# Patient Record
Sex: Female | Born: 1955
Health system: Southern US, Community
[De-identification: ages and names within clinical notes are randomized; demographics above are authoritative.]

## PROBLEM LIST (undated history)

## (undated) DIAGNOSIS — M722 Plantar fascial fibromatosis: Secondary | ICD-10-CM

## (undated) DIAGNOSIS — I10 Essential (primary) hypertension: Secondary | ICD-10-CM

## (undated) DIAGNOSIS — Z8639 Personal history of other endocrine, nutritional and metabolic disease: Secondary | ICD-10-CM

## (undated) DIAGNOSIS — K219 Gastro-esophageal reflux disease without esophagitis: Secondary | ICD-10-CM

## (undated) HISTORY — DX: Plantar fascial fibromatosis: M72.2

## (undated) HISTORY — DX: Gastro-esophageal reflux disease without esophagitis: K21.9

## (undated) HISTORY — PX: TONSILLECTOMY: SUR1361

## (undated) HISTORY — DX: Personal history of other endocrine, nutritional and metabolic disease: Z86.39

## (undated) HISTORY — PX: APPENDECTOMY: SHX54

## (undated) HISTORY — DX: Essential (primary) hypertension: I10

## (undated) HISTORY — PX: ABDOMINAL HYSTERECTOMY: SUR658

---

## 2000-08-02 ENCOUNTER — Ambulatory Visit (HOSPITAL_COMMUNITY): Admission: RE | Admit: 2000-08-02 | Discharge: 2000-08-02 | Payer: Self-pay | Admitting: Obstetrics and Gynecology

## 2000-08-02 ENCOUNTER — Encounter: Payer: Self-pay | Admitting: Obstetrics and Gynecology

## 2000-09-02 ENCOUNTER — Ambulatory Visit (HOSPITAL_COMMUNITY): Admission: RE | Admit: 2000-09-02 | Discharge: 2000-09-02 | Payer: Self-pay | Admitting: Obstetrics and Gynecology

## 2000-09-02 ENCOUNTER — Encounter: Payer: Self-pay | Admitting: Obstetrics and Gynecology

## 2002-08-06 ENCOUNTER — Other Ambulatory Visit: Admission: RE | Admit: 2002-08-06 | Discharge: 2002-08-06 | Payer: Self-pay | Admitting: Obstetrics and Gynecology

## 2002-08-10 ENCOUNTER — Ambulatory Visit (HOSPITAL_COMMUNITY): Admission: RE | Admit: 2002-08-10 | Discharge: 2002-08-10 | Payer: Self-pay | Admitting: Obstetrics and Gynecology

## 2002-08-10 ENCOUNTER — Encounter: Payer: Self-pay | Admitting: Obstetrics and Gynecology

## 2002-09-27 ENCOUNTER — Encounter (INDEPENDENT_AMBULATORY_CARE_PROVIDER_SITE_OTHER): Payer: Self-pay

## 2002-09-27 ENCOUNTER — Inpatient Hospital Stay (HOSPITAL_COMMUNITY): Admission: RE | Admit: 2002-09-27 | Discharge: 2002-09-29 | Payer: Self-pay | Admitting: Obstetrics and Gynecology

## 2003-09-02 ENCOUNTER — Ambulatory Visit (HOSPITAL_COMMUNITY): Admission: RE | Admit: 2003-09-02 | Discharge: 2003-09-02 | Payer: Self-pay | Admitting: Obstetrics and Gynecology

## 2005-09-29 ENCOUNTER — Ambulatory Visit (HOSPITAL_COMMUNITY): Admission: RE | Admit: 2005-09-29 | Discharge: 2005-09-29 | Payer: Self-pay | Admitting: Obstetrics and Gynecology

## 2007-05-19 ENCOUNTER — Ambulatory Visit (HOSPITAL_COMMUNITY): Admission: RE | Admit: 2007-05-19 | Discharge: 2007-05-19 | Payer: Self-pay | Admitting: *Deleted

## 2007-09-12 ENCOUNTER — Ambulatory Visit (HOSPITAL_COMMUNITY): Admission: RE | Admit: 2007-09-12 | Discharge: 2007-09-12 | Payer: Self-pay | Admitting: Obstetrics and Gynecology

## 2007-09-26 ENCOUNTER — Encounter: Admission: RE | Admit: 2007-09-26 | Discharge: 2007-09-26 | Payer: Self-pay | Admitting: Obstetrics and Gynecology

## 2008-07-26 ENCOUNTER — Encounter: Admission: RE | Admit: 2008-07-26 | Discharge: 2008-07-26 | Payer: Self-pay | Admitting: Internal Medicine

## 2008-11-01 ENCOUNTER — Encounter: Admission: RE | Admit: 2008-11-01 | Discharge: 2008-11-01 | Payer: Self-pay | Admitting: Obstetrics and Gynecology

## 2010-05-29 ENCOUNTER — Encounter: Admission: RE | Admit: 2010-05-29 | Discharge: 2010-05-29 | Payer: Self-pay | Admitting: Obstetrics and Gynecology

## 2010-10-11 ENCOUNTER — Encounter: Payer: Self-pay | Admitting: Obstetrics and Gynecology

## 2011-02-02 NOTE — Op Note (Signed)
NAMEDIERRA, RIESGO NO.:  [id number]   MEDICAL RECORD NO.:  [id number]          PATIENT TYPE:  AMB   LOCATION:  ENDO                         FACILITY:  [facility]   PHYSICIAN:  Georgiana Spinner, M.D.    DATE OF BIRTH:  [birth year]   DATE OF PROCEDURE:  DATE OF DISCHARGE:                               OPERATIVE REPORT   SURGEON:  Sabino Gasser, MD.   PROCEDURE:  Upper endoscopy.   INDICATIONS FOR PROCEDURE:  GERD.   ANESTHESIA:  Fentanyl 75 mcg, Versed 5 mg.   PROCEDURE IN DETAIL:  With the patient mildly sedated in the left  lateral decubitus position the Pentax videoscopic endoscope was inserted  into the mouth, passed under direct vision through the esophagus which  appeared normal. There was no evidence of Barrett esophagus.   We entered into the stomach, fundus, body and antrum, duodenal bulb and  second portion of the duodenum, all appeared normal. From this point the  endoscope was slowly withdrawn taking circumferential views of duodenal  mucosa until the endoscope had been pulled back into the stomach, placed  in retroflexion, viewed the stomach from below. The endoscope was  straightened and withdrawn, taking circumferential views of remaining  gastric and esophageal mucosa.   The patient's  vital signs and pulse oximetry remained stable. The  patient tolerated the procedure well without apparent complications.   FINDINGS:  Rather unremarkable examination.   PLAN:  Have the patient continue on medication and follow up with me as  needed.           ______________________________  Georgiana Spinner, M.D.     GMO/MEDQ  D:  05/19/2007  T:  05/20/2007  Job:  [id number]

## 2011-02-05 NOTE — Discharge Summary (Signed)
   Linda Short, Linda Short                           ACCOUNT NO.:  [id number]   MEDICAL RECORD NO.:  [id number]                   PATIENT TYPE:  INP   LOCATION:  9311                                 FACILITY:  [facility]   PHYSICIAN:  Zenaida Niece, M.D.             DATE OF BIRTH:  02/19/1956   DATE OF ADMISSION:  09/27/2002  DATE OF DISCHARGE:  09/29/2002                                 DISCHARGE SUMMARY   ADMISSION DIAGNOSIS:  Symptomatic leiomyomatous uterus.   DISCHARGE DIAGNOSES:  1. Symptomatic leiomyomatous uterus.  2. Endometriosis.  3. Left ovarian cyst.   PROCEDURES:  Total abdominal hysterectomy, left ovarian cystectomy, and  fulguration of endometriosis.   BRIEF HISTORY AND PHYSICAL:  This is a 55 year old white female para 1-0-0-1  with an enlarging uterus with regular menses that are getting heavier.  Retrospectively, she does have some pelvic pressure.  Ultrasound reveals an  increase in size of previously seen fibroids and the patient is admitted for  definitive surgical therapy.  Past history is significant for one vaginal  delivery and an appendectomy.  Physical exam is significant for a benign  abdomen with the uterine fundus palpable about half way to the umbilicus.  On pelvic exam cervix is normal, vagina is normal, and bimanual exam reveals  a 14-week size slightly irregular uterus.   HOSPITAL COURSE:  The patient was admitted on the day of surgery and  underwent the above-mentioned procedure under general anesthesia without  complications.  Estimated blood loss was 250 cc.  Uterus was enlarged with  palpable myomas.  She had endometriosis on the right uterosacral ligament,  and the left ovary had a 2 cm cyst which appeared to be an endometrioma.  Postoperatively, she did very well, was rapidly able to ambulate and  tolerate a diet.  She remained afebrile.  Preoperative hemoglobin 9.2,  postoperative 8.2.  On the morning of postoperative day #2, her incision was  healing well and her staples were removed and Steri-Strips applied.  She was  felt to be stable enough for discharge at that time.   DISCHARGE INSTRUCTIONS:  Regular diet, pelvic rest, and no strenuous  activity.   FOLLOW UP:  In ten days.   DISCHARGE MEDICATIONS:  Percocet, dispense #30, one to two p.o. q.4-6 h.  p.r.n. pain; and the patient is to take Naprosyn that she has at home, and  over-the-counter iron b.i.d.                                               Zenaida Niece, M.D.    TDM/MEDQ  D:  09/29/2002  T:  09/30/2002  Job:  [id number]

## 2011-02-05 NOTE — H&P (Signed)
NAME:  Linda Short, Linda Short                           ACCOUNT NO.:  [id number]   MEDICAL RECORD NO.:  [id number]                   PATIENT TYPE:  INP   LOCATION:  NA                                   FACILITY:  [facility]   PHYSICIAN:  Zenaida Niece, M.D.             DATE OF BIRTH:  [birth year]   DATE OF ADMISSION:  09/27/2002  DATE OF DISCHARGE:                                HISTORY & PHYSICAL   CHIEF COMPLAINT:  Symptomatic fibroid uterus.   HISTORY OF PRESENT ILLNESS:  This is a 55 year old white female para 1-0-0-1  who was seen for an annual exam on August 06, 2002.  She stated at that  time she was having mostly regular menses with a couple of months of heavier  bleeding and crampier menses.  No intermenstrual bleeding.  Retrospectively,  she does complain of urinary frequency and possibly some discomfort with  intercourse and with bowel movements.  Physical exam at that time revealed  an enlarged uterus from her previous exam.  Ultrasound performed on November  21 reveals an enlarged uterus with a dominant fibroid in the posterior wall  of the uterus which is increased in size from a prior ultrasound, now  measuring 6.6 x 6.7 x 6.1 cm.  There is a second fibroid in the fundus  measuring 2.1 x 1.8 x 1.8 cm.  No new fibroids were identified.  The left  ovary was slightly enlarged, less so than on previous ultrasounds.  Due to  this fairly rapidly enlarged uterus and the symptoms, options were discussed  with the patient and she wishes to proceed with definitive surgery with  hysterectomy.   PAST OBSTETRICAL HISTORY:  One vaginal delivery at term without  complications.   PAST MEDICAL HISTORY:  Occasional heartburn.   PAST SURGICAL HISTORY:  Appendectomy.   ALLERGIES:  None known.   CURRENT MEDICATIONS:  None.   SOCIAL HISTORY:  The patient is single and denies alcohol, tobacco, or drug  use.   FAMILY HISTORY:  No GYN or colon cancer.   REVIEW OF SYSTEMS:  Otherwise  negative.   PHYSICAL EXAMINATION:  GENERAL:  This is a well-developed, well-nourished  white female who is in no acute distress.  Weight is about 145 pounds.  NECK:  Supple without lymphadenopathy or thyromegaly.  LUNGS:  Clear to auscultation.  HEART:  Regular rate and rhythm without murmur.  ABDOMEN:  Soft, nontender, nondistended with the uterine fundus palpable  about halfway to the umbilicus.  EXTREMITIES:  Have no edema, are nontender.  PELVIC:  External genitalia are within normal limits.  Speculum exam reveals  a normal cervix and Pap smear returns normal.  Bimanual exam reveals an  approximately 14-week-size, slightly irregular uterus and this is confirmed  on rectovaginal exam.   ASSESSMENT:  Symptomatic leiomyomatous uterus.  Medical and surgical options  have been discussed with the patient and she wishes to  proceed with  hysterectomy.  She wishes to preserve her ovaries unless they appear  abnormal.  Risks of surgery including bleeding, infection, and damage to  surrounding organs have been discussed with the patient and she agrees to  proceed.   PLAN:  Admit the patient on the day of surgery for a total abdominal  hysterectomy with possible bilateral salpingo-oophorectomy, with the plan of  leaving the ovaries unless they appear abnormal.                                               Zenaida Niece, M.D.    TDM/MEDQ  D:  09/25/2002  T:  09/25/2002  Job:  [id number]

## 2011-02-05 NOTE — Op Note (Signed)
NAME:  JUDEE, HENNICK                           ACCOUNT NO.:  [id number]   MEDICAL RECORD NO.:  [id number]                   PATIENT TYPE:  INP   LOCATION:  9311                                 FACILITY:  [facility]   PHYSICIAN:  Zenaida Niece, M.D.             DATE OF BIRTH:  07/08/1956   DATE OF PROCEDURE:  09/27/2002  DATE OF DISCHARGE:                                 OPERATIVE REPORT   PREOPERATIVE DIAGNOSES:  Symptomatic leiomyomatous uterus.   POSTOPERATIVE DIAGNOSES:  1. Symptomatic leiomyomatous uterus.  2. Endometriosis.  3. Left ovarian cyst.   PROCEDURE:  1. Total abdominal hysterectomy.  2. Left ovarian cystectomy.  3. Fulguration of endometriosis.   SURGEON:  Zenaida Niece, M.D.   ASSISTANT:  Malachi Pro. Ambrose Mantle, M.D.   ANESTHESIA:  General endotracheal tube.   ESTIMATED BLOOD LOSS:  250 cubic centimeters.   FINDINGS:  An enlarged uterus with palpable myomas, normal right tube and  ovary, endometriosis at the right uterosacral ligament, left ovary with a 2  cm cyst with thick chocolate fluid consistent with an endometrioma, and some  powder burn spots consistent with endometriosis.   PROCEDURE IN DETAIL:  The patient was taken to the operating room and placed  in the dorsal supine position.  General anesthesia was induced and her  abdomen and vagina were prepped and draped in the usual sterile fashion and  in-and-out Foley catheter inserted.  Her abdomen was then entered via  standard Pfannenstiel incision.  Self retaining retractor was placed and the  uterus was found to be enlarged, but very mobile.  Both uterine cornu were  grasped with long Kelly clamps.  Both round ligaments were divided with  electrocautery and the anterior broad ligament dissected and transected  across the entire portion of the uterus to create the bladder flap which was  pushed inferiorly sharply and bluntly.  A window was made in the broad  ligament on each side through an avascular  portion.  The utero-ovarian  pedicles were then clamped with Zeppelin clamps, transected, and doubly  ligated with number 1 chromic.  Uterine arteries were skeletonized and  uterine arteries were clamped with Zeppelin clamps.  These pedicles were  then transected and ligated with number 1 chromic.  Cardinal ligaments and  uterosacral ligaments were clamped, transected, and ligated on each side  with number 1 chromic.  This was done pushing the bladder inferiorly with  good results.  The vaginal angles were then clamped with Zeppelin clamps and  entered sharply.  The remainder of the vagina was cut with scissors and  uterus was removed.  Vaginal angles were sutured with number 1 chromic and  held for later use.  The remainder of the vagina was closed with interrupted  sutures and number 1 chromic with adequate closure and hemostasis.  Bleeding  from the patient's right side down close to the bladder was controlled  with  3-0 Vicryl.  Spots of endometriosis on the right uterosacral ligament were  fulgurated with monopolar cautery.  Upon inspection of the left ovary there  was a 2 cm cyst which through a small hole leaked chocolate appearing fluid.  Using sharp and blunt dissection this ovarian cyst was shelled out from the  remainder of the ovary.  I attempted to close the ovary after maintained  hemostatic with electrocautery but the sutures just pulled through the  ovarian serosa.  The ovary was made hemostatic with electrocautery.  The  pelvis was copiously irrigated.  The uterosacral ligaments were plicated in  the midline with one suture of number 1 chromic with good plication.  The  patient did not have a deep cul-de-sac.  All pedicles were inspected and  found to be hemostatic.  Both ureters were identified and found to be free  of the operative field.  All sutures were then cut.  The bowel had been  packed out of the pelvis with one lap pad which was then removed.  All  instruments  were then removed from the pelvis and the retractor was removed.  The subfascial space was inspected and made hemostatic with electrocautery.  The fascia was closed in a running fashion starting at both ends and meeting  in the middle with 0 Vicryl.  Subcutaneous tissue was irrigated and made  hemostatic with electrocautery.  The skin was then closed with staples and a  sterile dressing.  The patient tolerated the procedure well.  Was extubated  in the operating room and taken to the recovery room in stable condition.  Counts were correct x2 and she was given Ancef 1 g preoperatively.                                               Zenaida Niece, M.D.    TDM/MEDQ  D:  09/27/2002  T:  09/27/2002  Job:  [id number]

## 2011-06-16 ENCOUNTER — Other Ambulatory Visit: Payer: Self-pay | Admitting: Registered Nurse

## 2011-07-08 ENCOUNTER — Other Ambulatory Visit: Payer: Self-pay | Admitting: Obstetrics and Gynecology

## 2011-07-08 DIAGNOSIS — Z1231 Encounter for screening mammogram for malignant neoplasm of breast: Secondary | ICD-10-CM

## 2011-07-14 ENCOUNTER — Ambulatory Visit: Payer: Self-pay

## 2011-07-14 ENCOUNTER — Ambulatory Visit
Admission: RE | Admit: 2011-07-14 | Discharge: 2011-07-14 | Disposition: A | Discharge status: Home / Self Care | Payer: BC Managed Care – PPO | Source: Ambulatory Visit | Attending: Obstetrics and Gynecology | Admitting: Obstetrics and Gynecology

## 2011-07-14 DIAGNOSIS — Z1231 Encounter for screening mammogram for malignant neoplasm of breast: Secondary | ICD-10-CM

## 2011-10-30 ENCOUNTER — Telehealth: Payer: Self-pay | Admitting: Internal Medicine

## 2011-10-30 NOTE — Telephone Encounter (Signed)
Pt called to report sneezing, watery eyes, stuffy nose all starting last evening. Had EGD/colon yesterday with Dr. Loreta Ave. No fevers, chills, cp, or dyspnea. Some clear nasal discharge.  No cough. I am not sure this is related to the procedure/sedation, but recommended decongestant and antihistamine OTC.  Also can try saline nasal spray. Will call back if not better or worse.  I will pass along to Dr. Loreta Ave.

## 2012-01-07 ENCOUNTER — Ambulatory Visit: Payer: Self-pay | Admitting: Internal Medicine

## 2012-11-27 ENCOUNTER — Other Ambulatory Visit: Payer: Self-pay

## 2012-11-27 DIAGNOSIS — Z1231 Encounter for screening mammogram for malignant neoplasm of breast: Secondary | ICD-10-CM

## 2012-11-29 ENCOUNTER — Ambulatory Visit
Admission: RE | Admit: 2012-11-29 | Discharge: 2012-11-29 | Disposition: A | Discharge status: Home / Self Care | Payer: BC Managed Care – PPO | Source: Ambulatory Visit

## 2012-11-29 DIAGNOSIS — Z1231 Encounter for screening mammogram for malignant neoplasm of breast: Secondary | ICD-10-CM

## 2013-07-11 ENCOUNTER — Other Ambulatory Visit: Payer: Self-pay | Admitting: Obstetrics and Gynecology

## 2013-07-11 DIAGNOSIS — N6315 Unspecified lump in the right breast, overlapping quadrants: Secondary | ICD-10-CM

## 2013-07-11 DIAGNOSIS — N63 Unspecified lump in unspecified breast: Secondary | ICD-10-CM

## 2013-07-12 ENCOUNTER — Ambulatory Visit
Admission: RE | Admit: 2013-07-12 | Discharge: 2013-07-12 | Disposition: A | Discharge status: Home / Self Care | Payer: BC Managed Care – PPO | Source: Ambulatory Visit | Attending: Obstetrics and Gynecology | Admitting: Obstetrics and Gynecology

## 2013-07-12 DIAGNOSIS — N6315 Unspecified lump in the right breast, overlapping quadrants: Secondary | ICD-10-CM

## 2014-11-16 ENCOUNTER — Ambulatory Visit: Payer: Self-pay | Admitting: Family Medicine

## 2017-09-27 DIAGNOSIS — Z6830 Body mass index (BMI) 30.0-30.9, adult: Secondary | ICD-10-CM | POA: Diagnosis not present

## 2017-09-27 DIAGNOSIS — Z713 Dietary counseling and surveillance: Secondary | ICD-10-CM | POA: Diagnosis not present

## 2017-09-27 DIAGNOSIS — E6609 Other obesity due to excess calories: Secondary | ICD-10-CM | POA: Diagnosis not present

## 2017-10-11 DIAGNOSIS — Z6830 Body mass index (BMI) 30.0-30.9, adult: Secondary | ICD-10-CM | POA: Diagnosis not present

## 2017-10-11 DIAGNOSIS — Z713 Dietary counseling and surveillance: Secondary | ICD-10-CM | POA: Diagnosis not present

## 2017-10-11 DIAGNOSIS — E6609 Other obesity due to excess calories: Secondary | ICD-10-CM | POA: Diagnosis not present

## 2017-10-18 DIAGNOSIS — Z6830 Body mass index (BMI) 30.0-30.9, adult: Secondary | ICD-10-CM | POA: Diagnosis not present

## 2017-10-18 DIAGNOSIS — Z713 Dietary counseling and surveillance: Secondary | ICD-10-CM | POA: Diagnosis not present

## 2017-10-18 DIAGNOSIS — E6609 Other obesity due to excess calories: Secondary | ICD-10-CM | POA: Diagnosis not present

## 2017-11-01 DIAGNOSIS — Z713 Dietary counseling and surveillance: Secondary | ICD-10-CM | POA: Diagnosis not present

## 2017-11-01 DIAGNOSIS — E6609 Other obesity due to excess calories: Secondary | ICD-10-CM | POA: Diagnosis not present

## 2017-11-01 DIAGNOSIS — Z6830 Body mass index (BMI) 30.0-30.9, adult: Secondary | ICD-10-CM | POA: Diagnosis not present

## 2017-11-08 DIAGNOSIS — E6609 Other obesity due to excess calories: Secondary | ICD-10-CM | POA: Diagnosis not present

## 2017-11-08 DIAGNOSIS — Z713 Dietary counseling and surveillance: Secondary | ICD-10-CM | POA: Diagnosis not present

## 2017-11-08 DIAGNOSIS — Z6830 Body mass index (BMI) 30.0-30.9, adult: Secondary | ICD-10-CM | POA: Diagnosis not present

## 2017-11-22 DIAGNOSIS — E6609 Other obesity due to excess calories: Secondary | ICD-10-CM | POA: Diagnosis not present

## 2017-11-22 DIAGNOSIS — Z6830 Body mass index (BMI) 30.0-30.9, adult: Secondary | ICD-10-CM | POA: Diagnosis not present

## 2017-11-22 DIAGNOSIS — Z713 Dietary counseling and surveillance: Secondary | ICD-10-CM | POA: Diagnosis not present

## 2017-12-01 DIAGNOSIS — E6609 Other obesity due to excess calories: Secondary | ICD-10-CM | POA: Diagnosis not present

## 2017-12-01 DIAGNOSIS — Z6830 Body mass index (BMI) 30.0-30.9, adult: Secondary | ICD-10-CM | POA: Diagnosis not present

## 2017-12-01 DIAGNOSIS — Z713 Dietary counseling and surveillance: Secondary | ICD-10-CM | POA: Diagnosis not present

## 2017-12-06 DIAGNOSIS — E6609 Other obesity due to excess calories: Secondary | ICD-10-CM | POA: Diagnosis not present

## 2017-12-06 DIAGNOSIS — Z6830 Body mass index (BMI) 30.0-30.9, adult: Secondary | ICD-10-CM | POA: Diagnosis not present

## 2017-12-06 DIAGNOSIS — Z713 Dietary counseling and surveillance: Secondary | ICD-10-CM | POA: Diagnosis not present

## 2017-12-13 DIAGNOSIS — Z6830 Body mass index (BMI) 30.0-30.9, adult: Secondary | ICD-10-CM | POA: Diagnosis not present

## 2017-12-13 DIAGNOSIS — E6609 Other obesity due to excess calories: Secondary | ICD-10-CM | POA: Diagnosis not present

## 2017-12-13 DIAGNOSIS — Z713 Dietary counseling and surveillance: Secondary | ICD-10-CM | POA: Diagnosis not present

## 2017-12-27 DIAGNOSIS — E6609 Other obesity due to excess calories: Secondary | ICD-10-CM | POA: Diagnosis not present

## 2017-12-27 DIAGNOSIS — Z713 Dietary counseling and surveillance: Secondary | ICD-10-CM | POA: Diagnosis not present

## 2017-12-27 DIAGNOSIS — Z6830 Body mass index (BMI) 30.0-30.9, adult: Secondary | ICD-10-CM | POA: Diagnosis not present

## 2018-01-03 DIAGNOSIS — Z6830 Body mass index (BMI) 30.0-30.9, adult: Secondary | ICD-10-CM | POA: Diagnosis not present

## 2018-01-03 DIAGNOSIS — Z713 Dietary counseling and surveillance: Secondary | ICD-10-CM | POA: Diagnosis not present

## 2018-01-03 DIAGNOSIS — E6609 Other obesity due to excess calories: Secondary | ICD-10-CM | POA: Diagnosis not present

## 2018-01-11 DIAGNOSIS — Z6830 Body mass index (BMI) 30.0-30.9, adult: Secondary | ICD-10-CM | POA: Diagnosis not present

## 2018-01-11 DIAGNOSIS — E6609 Other obesity due to excess calories: Secondary | ICD-10-CM | POA: Diagnosis not present

## 2018-01-11 DIAGNOSIS — Z713 Dietary counseling and surveillance: Secondary | ICD-10-CM | POA: Diagnosis not present

## 2018-03-01 DIAGNOSIS — Z1389 Encounter for screening for other disorder: Secondary | ICD-10-CM | POA: Diagnosis not present

## 2018-03-01 DIAGNOSIS — Z01419 Encounter for gynecological examination (general) (routine) without abnormal findings: Secondary | ICD-10-CM | POA: Diagnosis not present

## 2018-03-01 DIAGNOSIS — Z130 Encounter for screening for diseases of the blood and blood-forming organs and certain disorders involving the immune mechanism: Secondary | ICD-10-CM | POA: Diagnosis not present

## 2018-06-23 DIAGNOSIS — Z0000 Encounter for general adult medical examination without abnormal findings: Secondary | ICD-10-CM | POA: Diagnosis not present

## 2018-06-28 DIAGNOSIS — I10 Essential (primary) hypertension: Secondary | ICD-10-CM | POA: Diagnosis not present

## 2018-06-28 DIAGNOSIS — Z0000 Encounter for general adult medical examination without abnormal findings: Secondary | ICD-10-CM | POA: Diagnosis not present

## 2018-06-28 DIAGNOSIS — E7800 Pure hypercholesterolemia, unspecified: Secondary | ICD-10-CM | POA: Diagnosis not present

## 2018-06-28 DIAGNOSIS — Z23 Encounter for immunization: Secondary | ICD-10-CM | POA: Diagnosis not present

## 2018-11-17 DIAGNOSIS — I10 Essential (primary) hypertension: Secondary | ICD-10-CM | POA: Diagnosis not present

## 2018-11-17 DIAGNOSIS — I83893 Varicose veins of bilateral lower extremities with other complications: Secondary | ICD-10-CM | POA: Diagnosis not present

## 2018-11-17 DIAGNOSIS — E7800 Pure hypercholesterolemia, unspecified: Secondary | ICD-10-CM | POA: Diagnosis not present

## 2018-11-17 DIAGNOSIS — M7121 Synovial cyst of popliteal space [Baker], right knee: Secondary | ICD-10-CM | POA: Diagnosis not present

## 2018-11-17 DIAGNOSIS — M7989 Other specified soft tissue disorders: Secondary | ICD-10-CM | POA: Diagnosis not present

## 2020-06-20 ENCOUNTER — Ambulatory Visit (INDEPENDENT_AMBULATORY_CARE_PROVIDER_SITE_OTHER): Payer: 59 | Admitting: Family Medicine

## 2020-06-20 ENCOUNTER — Ambulatory Visit: Payer: Self-pay

## 2020-06-20 ENCOUNTER — Other Ambulatory Visit: Payer: Self-pay

## 2020-06-20 ENCOUNTER — Encounter: Payer: Self-pay | Admitting: Family Medicine

## 2020-06-20 VITALS — BP 130/90 | HR 70 | Ht 64.0 in | Wt 192.0 lb

## 2020-06-20 DIAGNOSIS — M25512 Pain in left shoulder: Secondary | ICD-10-CM

## 2020-06-20 NOTE — Progress Notes (Signed)
Subjective:    I'm seeing this patient as a consultation for:  Merri Brunette, MD . Note will be routed back to referring provider/PCP.  CC: Shoulder pain   HPI: Patient is a 64 year old female presenting to [facility] for L shoulder pain X1 month. States used the power washer that same weekend it started. Front of shoulder that has a tight feeling. States that she has limited ROM she cannot put her arm behind her back.  She cannot recall any injury.  Radiating: yes down the arm to bicep Mechanical symptoms: no  Aggravating symptoms: cannot sleep on that side; lifting arm; putting arm behind back Neck pain: no Tried: ibuprofen; Advil; bengay; elevation  Past medical history, Surgical history, Family history, Social history, Allergies, and medications have been entered into the medical record, reviewed.   Review of Systems: No new headache, visual changes, nausea, vomiting, diarrhea, constipation, dizziness, abdominal pain, skin rash, fevers, chills, night sweats, weight loss, swollen lymph nodes, body aches, joint swelling, muscle aches, chest pain, shortness of breath, mood changes, visual or auditory hallucinations.   Objective:    Vitals:   06/20/20 1011  BP: 130/90  Pulse: 70  SpO2: 90%   General: Well Developed, well nourished, and in no acute distress.  Neuro/Psych: Alert and oriented x3, extra-ocular muscles intact, able to move all 4 extremities, sensation grossly intact. Skin: Warm and dry, no rashes noted.  Respiratory: Not using accessory muscles, speaking in full sentences, trachea midline.  Cardiovascular: Pulses palpable, no extremity edema. Abdomen: Does not appear distended. MSK: Left shoulder normal-appearing nontender Range of motion abduction 100 degrees internal rotation iliac crest posterior aspect, external rotation full. Strength intact abduction external and internal rotation.  Positive empty can Hawkins Neer's test.  Negative Yergason's  and speeds test.  C-spine normal-appearing nontender normal motion.  Lab and Radiology Results Diagnostic Limited MSK Ultrasound of: Left shoulder Biceps tendon intact normal-appearing Subscapularis tendon intact normal. Supraspinatus tendon intact moderate increased subacromial bursa thickness present. Infraspinatus tendon normal-appearing AC joint degenerative narrowed with effusion. Impression: Subacromial bursitis and AC DJD   Impression and Recommendations:    Assessment and Plan: 64 y.o. female with left shoulder pain due to subacromial bursitis predominantly.  Discussed treatment options and plan.  Plan for physical therapy and Voltaren gel.  Discussed possibility of injection patient would like to try conservative management first which is somewhat reasonable.  Recheck back in 6 weeks or so if not improved.  Next step would be injection plus or minus x-ray and MRI..   Orders Placed This Encounter  Procedures  . [place] LIMITED JOINT SPACE STRUCTURES UP LEFT(NO LINKED CHARGES)    Standing Status:   Future    Number of Occurrences:   1    Standing Expiration Date:   06/20/2021    Order Specific Question:   Reason for Exam (SYMPTOM  OR DIAGNOSIS REQUIRED)    Answer:   Left shoulder pain    Order Specific Question:   Preferred imaging location?    Answer:   Adult nurse [facility]  . Ambulatory referral to Physical Therapy    Referral Priority:   Routine    Referral Type:   Physical Medicine    Referral Reason:   Specialty Services Required    Requested Specialty:   Physical Therapy   No orders of the defined types were placed in this encounter.   Discussed warning signs or symptoms. Please see discharge instructions. Patient expresses understanding.  The above documentation has been reviewed and is accurate and complete Lynne Leader, M.D.

## 2020-06-20 NOTE — Patient Instructions (Signed)
Thank you for coming in today.  Lets plan for physical therapy.  Please use voltaren gel up to 4x daily for pain as needed.  Let me know if you do not hear from PT or have trouble scheduling.  If you know a specific location you want to go to that is helpful.   Recheck after about 4 weeks of PT (6 weeks from now) . Ok to cancel if feeling better.   Please let me know if you are having a problem or this is clearly not working.    Shoulder Impingement Syndrome  Shoulder impingement syndrome is a condition that causes pain when connective tissues (tendons) surrounding the shoulder joint become pinched. These tendons are part of the group of muscles and tissues that help to stabilize the shoulder (rotator cuff). Beneath the rotator cuff is a fluid-filled sac (bursa) that allows the muscles and tendons to glide smoothly. The bursa may become swollen or irritated (bursitis). Bursitis, swelling in the rotator cuff tendons, or both conditions can decrease how much space is under a bone in the shoulder joint (acromion), resulting in impingement. What are the causes? Shoulder impingement syndrome may be caused by bursitis or swelling of the rotator cuff tendons, which may result from:  Repetitive overhead arm movements.  Falling onto the shoulder.  Weakness in the shoulder muscles. What increases the risk? You may be more likely to develop this condition if you:  Play sports that involve throwing, such as baseball.  Participate in sports such as tennis, volleyball, and swimming.  Work as a Education administrator, Music therapist, or Pharmacologist. Some people are also more likely to develop impingement syndrome because of the shape of their acromion bone. What are the signs or symptoms? The main symptom of this condition is pain on the front or side of the shoulder. The pain may:  Get worse when lifting or raising the arm.  Get worse at night.  Wake you up from sleeping.  Feel sharp when the shoulder is  moved and then fade to an ache. Other symptoms may include:  Tenderness.  Stiffness.  Inability to raise the arm above shoulder level or behind the body.  Weakness. How is this diagnosed? This condition may be diagnosed based on:  Your symptoms and medical history.  A physical exam.  Imaging tests, such as: ? X-rays. ? MRI. ? Ultrasound. How is this treated? This condition may be treated by:  Resting your shoulder and avoiding all activities that cause pain or put stress on the shoulder.  Icing your shoulder.  NSAIDs to help reduce pain and swelling.  One or more injections of medicines to numb the area and reduce inflammation.  Physical therapy.  Surgery. This may be needed if nonsurgical treatments have not helped. Surgery may involve repairing the rotator cuff, reshaping the acromion, or removing the bursa. Follow these instructions at home: Managing pain, stiffness, and swelling   If directed, put ice on the injured area. ? Put ice in a plastic bag. ? Place a towel between your skin and the bag. ? Leave the ice on for 20 minutes, 2-3 times a day. Activity  Rest and return to your normal activities as told by your health care provider. Ask your health care provider what activities are safe for you.  Do exercises as told by your health care provider. General instructions  Do not use any products that contain nicotine or tobacco, such as cigarettes, e-cigarettes, and chewing tobacco. These can delay healing. If you  need help quitting, ask your health care provider.  Ask your health care provider when it is safe for you to drive.  Take over-the-counter and prescription medicines only as told by your health care provider.  Keep all follow-up visits as told by your health care provider. This is important. How is this prevented?  Give your body time to rest between periods of activity.  Be safe and responsible while being active. This will help you avoid  falls.  Maintain physical fitness, including strength and flexibility. Contact a health care provider if:  Your symptoms have not improved after 1-2 months of treatment and rest.  You cannot lift your arm away from your body. Summary  Shoulder impingement syndrome is a condition that causes pain when connective tissues (tendons) surrounding the shoulder joint become pinched.  The main symptom of this condition is pain on the front or side of the shoulder.  This condition is usually treated with rest, ice, and pain medicines as needed. This information is not intended to replace advice given to you by your health care provider. Make sure you discuss any questions you have with your health care provider. Document Revised: 12/29/2018 Document Reviewed: 03/01/2018 [employer] Patient Education  2020 [employer]    Shoulder Impingement Syndrome Rehab Ask your health care provider which exercises are safe for you. Do exercises exactly as told by your health care provider and adjust them as directed. It is normal to feel mild stretching, pulling, tightness, or discomfort as you do these exercises. Stop right away if you feel sudden pain or your pain gets worse. Do not begin these exercises until told by your health care provider. Stretching and range-of-motion exercise This exercise warms up your muscles and joints and improves the movement and flexibility of your shoulder. This exercise also helps to relieve pain and stiffness. Passive horizontal adduction In passive adduction, you use your other hand to move the injured arm toward your body. The injured arm does not move on its own. In this movement, your arm is moved across your body in the horizontal plane (horizontal adduction). 1. Sit or stand and pull your left / right elbow across your chest, toward your other shoulder. Stop when you feel a gentle stretch in the back of your shoulder and upper arm. ? Keep your arm at shoulder  height. ? Keep your arm as close to your body as you comfortably can. 2. Hold for __________ seconds. 3. Slowly return to the starting position. Repeat __________ times. Complete this exercise __________ times a day. Strengthening exercises These exercises build strength and endurance in your shoulder. Endurance is the ability to use your muscles for a long time, even after they get tired. External rotation, isometric This is an exercise in which you press the back of your wrist against a door frame without moving your shoulder joint (isometric). 1. Stand or sit in a doorway, facing the door frame. 2. Bend your left / right elbow and place the back of your wrist against the door frame. Only the back of your wrist should be touching the frame. Keep your upper arm at your side. 3. Gently press your wrist against the door frame, as if you are trying to push your arm away from your abdomen (external rotation). Press as hard as you are able without pain. ? Avoid shrugging your shoulder while you press your wrist against the door frame. Keep your shoulder blade tucked down toward the middle of your back. 4. Hold for  __________ seconds. 5. Slowly release the tension, and relax your muscles completely before you repeat the exercise. Repeat __________ times. Complete this exercise __________ times a day. Internal rotation, isometric This is an exercise in which you press your palm against a door frame without moving your shoulder joint (isometric). 1. Stand or sit in a doorway, facing the door frame. 2. Bend your left / right elbow and place the palm of your hand against the door frame. Only your palm should be touching the frame. Keep your upper arm at your side. 3. Gently press your hand against the door frame, as if you are trying to push your arm toward your abdomen (internal rotation). Press as hard as you are able without pain. ? Avoid shrugging your shoulder while you press your hand against the  door frame. Keep your shoulder blade tucked down toward the middle of your back. 4. Hold for __________ seconds. 5. Slowly release the tension, and relax your muscles completely before you repeat the exercise. Repeat __________ times. Complete this exercise __________ times a day. Scapular protraction, supine  1. Lie on your back on a firm surface (supine position). Hold a __________ weight in your left / right hand. 2. Raise your left / right arm straight into the air so your hand is directly above your shoulder joint. 3. Push the weight into the air so your shoulder (scapula) lifts off the surface that you are lying on. The scapula will push up or forward (protraction). Do not move your head, neck, or back. 4. Hold for __________ seconds. 5. Slowly return to the starting position. Let your muscles relax completely before you repeat this exercise. Repeat __________ times. Complete this exercise __________ times a day. Scapular retraction  1. Sit in a stable chair without armrests, or stand up. 2. Secure an exercise band to a stable object in front of you so the band is at shoulder height. 3. Hold one end of the exercise band in each hand. Your palms should face down. 4. Squeeze your shoulder blades together (retraction) and move your elbows slightly behind you. Do not shrug your shoulders upward while you do this. 5. Hold for __________ seconds. 6. Slowly return to the starting position. Repeat __________ times. Complete this exercise __________ times a day. Shoulder extension  1. Sit in a stable chair without armrests, or stand up. 2. Secure an exercise band to a stable object in front of you so the band is above shoulder height. 3. Hold one end of the exercise band in each hand. 4. Straighten your elbows and lift your hands up to shoulder height. 5. Squeeze your shoulder blades together and pull your hands down to the sides of your thighs (extension). Stop when your hands are straight  down by your sides. Do not let your hands go behind your body. 6. Hold for __________ seconds. 7. Slowly return to the starting position. Repeat __________ times. Complete this exercise __________ times a day. This information is not intended to replace advice given to you by your health care provider. Make sure you discuss any questions you have with your health care provider. Document Revised: 12/29/2018 Document Reviewed: 10/02/2018 [employer] Patient Education  2020 [employer].

## 2020-07-02 ENCOUNTER — Encounter: Payer: Self-pay | Admitting: Physical Therapy

## 2020-07-02 ENCOUNTER — Ambulatory Visit: Payer: 59 | Attending: Family Medicine | Admitting: Physical Therapy

## 2020-07-02 ENCOUNTER — Other Ambulatory Visit: Payer: Self-pay

## 2020-07-02 DIAGNOSIS — M25512 Pain in left shoulder: Secondary | ICD-10-CM | POA: Diagnosis not present

## 2020-07-02 DIAGNOSIS — M6281 Muscle weakness (generalized): Secondary | ICD-10-CM | POA: Diagnosis present

## 2020-07-02 NOTE — Therapy (Signed)
[facility] PH[facility]OR[facility] MEDICINE [address], [place], [address] Phone: [phone]   Fax:  [phone]  Physical Therapy Evaluation  Patient Details  Name: Linda Short [facility] MRN: [id number] Date of Birth: 1955/11/02 Referring Provider (PT): Rodolph Bong, MD   Encounter Date: 07/02/2020   PT End of Session - 07/02/20 1820    Visit Number 1    Number of Visits 24    Date for PT Re-Evaluation 09/24/20    Authorization Type [employer] reporting period from 07/02/2020    Progress Note Due on Visit 10    PT Start Time 1650    PT Stop Time 1750    PT Time Calculation (min) 60 min    Activity Tolerance Patient tolerated treatment well    Behavior During Therapy [facility] for tasks assessed/performed           History reviewed. No pertinent past medical history.  History reviewed. No pertinent surgical history.  There were no vitals filed for this visit.    Subjective Assessment - 07/02/20 1656    Subjective Patient reports she is unsure how her L shoulder pain started but it has been going on from mid to late July 2021.She can lift things okay but the range of motion over her head and behind her back is limited and painful. The only thing she remembers doing at the end of July ways power washing and the machine was on the ground and she was using her right arm. She was swimming in the ocean in mid August and that irritated. She feels like her pain is mostly staying the same since it started. No injections or oral steroids. Denies history of of neck pain/surgery/injury. Patient reports pain radiates over the L bicep but the most tender is at the anterior shoulder.    Pertinent History Patient is a 64 y.o. female who presents to outpatient physical therapy with a referral for medical diagnosis acute pain of left shoulder. This patient's chief complaints consist of left shoulder and arm pain and lack of ROM leading to the  following functional deficits: difficulty sleeping, lifting things overhead, reaching behind back and out to grab something, using L UE over head, grooming, dressing, swimming in the ocean, exercising for weight loss and fitness (especially working out arms). Relevant past medical history and comorbidities include hypertension, obesity. Patient denies hx of cancer, stroke, seizures, lung problem, major cardiac events, diabetes, unexplained weight loss, changes in bowel or bladder problems, new onset stumbling or dropping things, prolonged use of steroid medication. Bone density scan a few years ago was fine. She has not had COVID19 that she knows of and has had two Pfizer COVID19 vaccinations.    Limitations Lifting;Other (comment)   leeping, lifting things overhead, reaching behind back and out to grab something, using L UE over head, grooming, dressing, swimming in the ocean, exercising for weight loss and fitness (especially working out arms).   Diagnostic tests 06/20/2020 Diagnostic Limited [facility] Ultrasound of: Left shoulderBiceps tendon intact normal-appearingSubscapularis tendon intact normal.Supraspinatus tendon intact moderate increased subacromial bursa thickness present.Infraspinatus tendon normal-appearingAC joint degenerative narrowed with effusion.Impression: Subacromial bursitis and AC DJD    Patient Stated Goals reduce the pain and get full range of motion and function    Currently in Pain? Yes    Pain Score 2    W: 8/10; B 1/10   Pain Location Shoulder    Pain Orientation Left;Anterior;Proximal;Upper    Pain Descriptors / Indicators  Aching;Dull    Pain Radiating Towards denies numbness or tingling anywhere    Pain Onset More than a month ago    Pain Frequency Constant    Aggravating Factors  laying/sleeping on the left side, moving L UE behind back or overhead,    Pain Relieving Factors keep L UE below shoulders, resting L UE on pillow when sleeping and on the couch, voltaren gel.     Effect of Pain on Daily Activities Functional Limitations: sleeping, lifting things overhead, reaching behind back and out to grab something, using L UE over head, grooming, dressing, swimming in the ocean, exercising for weight loss and fitness (especially working out arms).              [facility] PT Assessment - 07/02/20 0001      Assessment   Medical Diagnosis Acute pain of left shoulder    Referring Provider (PT) Rodolph Bong, MD    Onset Date/Surgical Date --   end of july 2021   Hand Dominance Right    Next MD Visit 4 weeks (maybe)    Prior Therapy No PT for this condition prior to current episode of care      Precautions   Precautions None      Restrictions   Weight Bearing Restrictions No      Balance Screen   Has the patient fallen in the past 6 months No    Has the patient had a decrease in activity level because of a fear of falling?  No    Is the patient reluctant to leave their home because of a fear of falling?  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  Marland Kitchen Posture with mild forward head and rounded shoulders in sitting.  . Tremor: none . Muscle bulk: generally WFL . Bed mobility: supine <> sit I with care to avoid bothering L UE . Transfers: sit<>stand WFL . Gait: grossly WFL for household and short community ambulation. More detailed gait analysis deferred to later date as needed.    NEUROLOGICAL Dermatomes . C3-T1 appears equal and intact to light touch.   SPINE SCREEN Cervical Spine AROM WFL in all directions with overpressure no effect on L shoulder pain.  Negative cervical axial compression, distraction. Negative spurling's test bilaterally   PERIPHERAL JOINT MOTION (in degrees) Active Range of Motion (AROM) *Indicates pain 07/02/20 Date Date  Joint/Motion R/L R/L R/L  Shoulder     Flexion 155/101* / /  Extension 50/36* / /  Abduction 155/60* / /  External rotation 64/56 / /  Internal rotation T6/glute* / /  Comments:  07/02/20: ER measured at neutral position. B elbows and wrists WFL.   Passive Range of Motion (PROM) *Indicates pain 1013/21 Date Date  Joint/Motion R/L R/L R/L  Shoulder  Flexion /125* / /  Extension / / /  Abduction /80* / /  External rotation /55* / /  Internal rotation / / /  Comments:  07/02/20: Left shoulder ER at 40* abduction. R UE WNL.   MUSCLE PERFORMANCE (MMT):  *Indicates pain 07/02/20 Date Date  Joint/Motion R/L R/L R/L  Shoulder     Flexion 4/4 / /  Abduction 5/3+* / /  External rotation 4+/4* / /  Internal rotation 4+/4+ / /  Extension / / /  Elbow     Flexion 5/4+ / /  Extension 4+/4+ / /  Hand     Grip B WFL / /  Comments:  07/02/20: within available range  SPECIAL TESTS: Empty Can: L positive Hawkins-Kennedy: L positive Neer's test: L positive ER lag sign: L negative  Speed's test: L negative Yourgasun's: L negative (R negative and all above special tests)  PALPATION: - TTP at L supraspinatus region  Objective measurements completed on examination: See above findings.   TREATMENT:   Therapeutic exercise: to centralize symptoms and improve ROM, strength, muscular endurance, and activity tolerance required for successful completion of functional activities.  - Education on diagnosis, prognosis, POC, anatomy and physiology of current condition. Education on produce, no worse principle.   - supine AAROM B shoulder flexion with PVC stick, 1x20 in tolerated range.  - seated B shoulder ER against green theraband 1x 10 - standing scapular row with green theraband. 1x10 - Education on HEP including handout    Patient response to treatment:  Pt tolerated treatment well. Pt was able to complete all exercises with minimal to no lasting increase in pain or discomfort. Pt required multimodal cuing for proper technique and to facilitate improved neuromuscular control, strength, range of motion, and functional ability resulting in improved performance and form.   HOME EXERCISE PROGRAM  Access Code: [email or web address] URL: [email or web address] Date: 07/02/2020 Prepared by: Norton Blizzard  Exercises Supine Shoulder Flexion with Dowel - 2 x daily - 20 reps - 5 second s hold Shoulder External Rotation with Resistance - 1 x daily - 3 sets - 10 reps - 5 seconds hold Row with band/cable - 1 x daily - 3 sets - 10 reps - 5 seconds hold      PT Education - 07/02/20 1827    Education Details Exercise purpose/form. Self management techniques. Education on diagnosis, prognosis, POC, anatomy and physiology of current condition Education on HEP including handout    Person(s) Educated Patient    Methods Explanation;Demonstration;Tactile cues;Verbal cues;Handout    Comprehension Verbalized understanding;Returned demonstration;Verbal cues required;Tactile cues required;Need further instruction            PT Short Term Goals - 07/02/20 1821      PT SHORT TERM GOAL #1   Title Be independent with initial home exercise program for self-management of symptoms.    Baseline Initial HEP provided at IE (07/02/2020);    Time 2    Period Weeks    Status New    Target Date 07/16/20             PT Long Term Goals - 07/02/20 1816      PT LONG TERM GOAL #1   Title Be independent with a long-term home exercise program for self-management of symptoms.    Baseline Initial HEP provided at IE  (07/02/2020);    Time 12    Period Weeks    Status New   TARGET DATE FOR ALL LONG TERM GOIALS: 09/24/2020  PT LONG TERM GOAL #2   Title Demonstrate improved FOTO score to equal or greater than 69 by visit #9 to demonstrate improvement in overall condition and self-reported functional ability.    Baseline 62 (07/02/2020);    Time 12    Period Weeks    Status New      PT LONG TERM GOAL #3   Title Have full left shoulder AROM with no compensations or increase in pain in all planes except intermittent end range discomfort to allow patient to complete valued activities with less difficulty.    Baseline Limited and painful - see abjective exam (07/02/2020);    Time 12    Period Weeks    Status New      PT LONG TERM GOAL #4   Title Improve left shoulder strength to 4+/5 with no increase in pain for improved ability to allow patient to complete valued functional tasks such as reaching overhead and dressing with less difficulty.    Baseline weak and painful - see objective exam (07/02/2020);    Time 12    Period Weeks    Status New      PT LONG TERM GOAL #5   Title Complete community, work and/or recreational activities without limitation due to current condition.    Baseline Functional Limitations: sleeping, lifting things overhead, reaching behind back and out to grab something, using L UE over head, grooming, dressing, swimming in the ocean, exercising for weight loss and fitness (especially working out arms) (07/02/2020);    Time 12    Period Weeks    Status New                  Plan - 07/02/20 1824    Clinical Impression Statement Patient is a 64 y.o. female referred to outpatient physical therapy with a medical diagnosis of acute pain of left shoulder who presents with signs and symptoms consistent with left shoulder pain that can be described as rotator cuff related pain syndrome. Patient presents with significant pain, ROM, muscle performance (strength/power/endurance),  joint stiffness, and activity tolerance impairments that are limiting ability to complete her usual activities such as sleeping, lifting things overhead, reaching behind back and out to grab something, using L UE over head, grooming, dressing, swimming in the ocean, exercising for weight loss and fitness (especially working out arms) without difficulty. Patient will benefit from skilled physical therapy intervention to address current body structure impairments and activity limitations to improve function and work towards goals set in current POC in order to return to prior level of function or maximal functional improvement.    Personal Factors and Comorbidities Age;Comorbidity 2;Profession;Past/Current Experience;Time since onset of injury/illness/exacerbation    Comorbidities Relevant past medical history and comorbidities include hypertension, obesity.    Examination-Activity Limitations Bathing;Bed Mobility;Hygiene/Grooming;Lift;Reach Overhead;Sleep;Dressing    Examination-Participation Restrictions Other   sleeping, lifting things overhead, reaching behind back and out to grab something, using L UE over head, grooming, dressing, swimming in the ocean, exercising for weight loss and fitness (especially working out arms).   Stability/Clinical Decision Making Stable/Uncomplicated    Clinical Decision Making Low    Rehab Potential Excellent    PT Frequency 2x / week    PT Duration 12 weeks    PT Treatment/Interventions ADLs/Self Care Home Management;Aquatic Therapy;Cryotherapy;Moist Heat;Therapeutic activities;Therapeutic exercise;Neuromuscular re-education;Patient/family education;Manual techniques;Dry needling;Passive range of motion;Taping;Joint Manipulations;Spinal Manipulations    PT Next Visit Plan update HEP as appropriate, progressive strengthening and ROM as tolerated    PT Home Exercise  Plan [employer]  Access Code: [email or web address]    Consulted and Agree with Plan of Care Patient            Patient will benefit from skilled therapeutic intervention in order to improve the following deficits and impairments:  Pain, Increased muscle spasms, Decreased activity tolerance, Decreased endurance, Decreased range of motion, Decreased strength, Hypomobility, Impaired UE functional use, Impaired perceived functional ability, Impaired flexibility  Visit Diagnosis: Left shoulder pain, unspecified chronicity  Muscle weakness (generalized)     Problem List There are no problems to display for this patient.   Luretha Murphy. Ilsa Iha, PT, DPT 07/02/20, 6:28 PM  [facility] PHYS[facility] SPORTS MEDICINE [address] [place], [address] Phone: [phone]   Fax:  [phone]  Name: Linda Short MRN: [id number] Date of Birth: 07/19/1956

## 2020-07-07 ENCOUNTER — Ambulatory Visit: Payer: 59 | Admitting: Physical Therapy

## 2020-07-07 ENCOUNTER — Other Ambulatory Visit: Payer: Self-pay

## 2020-07-07 DIAGNOSIS — M25512 Pain in left shoulder: Secondary | ICD-10-CM | POA: Diagnosis not present

## 2020-07-07 DIAGNOSIS — M6281 Muscle weakness (generalized): Secondary | ICD-10-CM

## 2020-07-07 NOTE — Therapy (Signed)
[place] [facility]ORTS MEDICINE [address] [place], [address] Phone: [phone]   Fax:  [phone]  Physical Therapy Treatment  Patient Details  Name: Linda Short MRN: [id number] Date of Birth: 08/25/1956 Referring Provider (PT): Rodolph Bong, MD   Encounter Date: 07/07/2020   PT End of Session - 07/07/20 1426    Visit Number 2    Number of Visits 24    Date for PT Re-Evaluation 09/24/20    Authorization Type [employer] reporting period from 07/02/2020    Progress Note Due on Visit 10    PT Start Time 1306    PT Stop Time 1345    PT Time Calculation (min) 39 min    Activity Tolerance Patient tolerated treatment well    Behavior During Therapy [facility] for tasks assessed/performed           No past medical history on file.  No past surgical history on file.  There were no vitals filed for this visit.    TREATMENT:  denies sensitivity to latex  Therapeutic exercise:to centralize symptoms and improve ROM, strength, muscular endurance, and activity tolerance required for successful completion of functional activities.  - standing scapular row with cable @15 # 3x10 - seated B shoulder ER against green theraband 1x 10  Circuit: - standing L shoulder ER with towel roll under arm, 3x10 with red theraband (attempted green but too tough) - standing L shoulder ER with towel roll under arm, 3x10 with green theraband  Circuit:  - ambulation x 100 feet while holding DB in left hand with shoulder abducted 30-45 degrees, 1x2#DB, 3x4#DB - standing R shoulder lateral raise to 90 degrees abduction, thumb forward, 2x4#DB, and 1x6#DB.   - hooklying AAROM B shoulder flexion with PVC stick, 1x20 in tolerated range.  - hooklying chest press with PVC loaded with 7.5#AW  Manual therapy: to reduce pain and tissue tension, improve range of motion, neuromodulation, in order to promote improved ability to complete  functional activities. - hooklying PROM L shoulder flexion x 15, abduction x2 (painful) - hooklying L GHJ mobilizations grades II-III posterior, caudal in neutral, caudal as moving to flexion and abduction and in abduction.    HOME EXERCISE PROGRAM  Access Code: URL: [email or web address] Date: 07/02/2020 Prepared by: 07/04/2020  Exercises Supine Shoulder Flexion with Dowel - 2 x daily - 20 reps - 5 second s hold Shoulder External Rotation with Resistance - 1 x daily - 3 sets - 10 reps - 5 seconds hold Row with band/cable - 1 x daily - 3 sets - 10 reps - 5 seconds hold    PT Education - 07/07/20 1415    Education Details Exercise purpose/form. Self management techniques    Person(s) Educated Patient    Methods Explanation;Demonstration;Tactile cues;Verbal cues;Handout    Comprehension Verbalized understanding;Returned demonstration;Tactile cues required;Verbal cues required;Need further instruction            PT Short Term Goals - 07/07/20 1415      PT SHORT TERM GOAL #1   Title Be independent with initial home exercise program for self-management of symptoms.    Baseline Initial HEP provided at IE (07/02/2020);    Time 2    Period Weeks    Status Achieved    Target Date 07/16/20             PT Long Term Goals - 07/02/20 1816      PT LONG TERM GOAL #1  Title Be independent with a long-term home exercise program for self-management of symptoms.    Baseline Initial HEP provided at IE (07/02/2020);    Time 12    Period Weeks    Status New   TARGET DATE FOR ALL LONG TERM GOIALS: 09/24/2020     PT LONG TERM GOAL #2   Title Demonstrate improved FOTO score to equal or greater than 69 by visit #9 to demonstrate improvement in overall condition and self-reported functional ability.    Baseline 62 (07/02/2020);    Time 12    Period Weeks    Status New      PT LONG TERM GOAL #3   Title Have full left shoulder AROM with no compensations or  increase in pain in all planes except intermittent end range discomfort to allow patient to complete valued activities with less difficulty.    Baseline Limited and painful - see abjective exam (07/02/2020);    Time 12    Period Weeks    Status New      PT LONG TERM GOAL #4   Title Improve left shoulder strength to 4+/5 with no increase in pain for improved ability to allow patient to complete valued functional tasks such as reaching overhead and dressing with less difficulty.    Baseline weak and painful - see objective exam (07/02/2020);    Time 12    Period Weeks    Status New      PT LONG TERM GOAL #5   Title Complete community, work and/or recreational activities without limitation due to current condition.    Baseline Functional Limitations: sleeping, lifting things overhead, reaching behind back and out to grab something, using L UE over head, grooming, dressing, swimming in the ocean, exercising for weight loss and fitness (especially working out arms) (07/02/2020);    Time 12    Period Weeks    Status New                 Plan - 07/07/20 1415    Clinical Impression Statement Patient tolerated treatment well overall and shows improvement in AROM, PROM as well as activity tolerance this session. Continues to be unable to abduct left shoulder to 90 degrees. Patient would benefit from continued management of limiting condition by skilled physical therapist to address remaining impairments and functional limitations to work towards stated goals and return to PLOF or maximal functional independence.    Personal Factors and Comorbidities Age;Comorbidity 2;Profession;Past/Current Experience;Time since onset of injury/illness/exacerbation    Comorbidities Relevant past medical history and comorbidities include hypertension, obesity.    Examination-Activity Limitations Bathing;Bed Mobility;Hygiene/Grooming;Lift;Reach Overhead;Sleep;Dressing    Examination-Participation Restrictions  Other   sleeping, lifting things overhead, reaching behind back and out to grab something, using L UE over head, grooming, dressing, swimming in the ocean, exercising for weight loss and fitness (especially working out arms).   Stability/Clinical Decision Making Stable/Uncomplicated    Rehab Potential Excellent    PT Frequency 2x / week    PT Duration 12 weeks    PT Treatment/Interventions ADLs/Self Care Home Management;Aquatic Therapy;Cryotherapy;Moist Heat;Therapeutic activities;Therapeutic exercise;Neuromuscular re-education;Patient/family education;Manual techniques;Dry needling;Passive range of motion;Taping;Joint Manipulations;Spinal Manipulations    PT Next Visit Plan update HEP as appropriate, progressive strengthening and ROM as tolerated    PT Home Exercise Plan Medbridge  Access Code: [email or web address]    Consulted and Agree with Plan of Care Patient           Patient will benefit from skilled therapeutic intervention in order to  improve the following deficits and impairments:  Pain, Increased muscle spasms, Decreased activity tolerance, Decreased endurance, Decreased range of motion, Decreased strength, Hypomobility, Impaired UE functional use, Impaired perceived functional ability, Impaired flexibility  Visit Diagnosis: Left shoulder pain, unspecified chronicity  Muscle weakness (generalized)     Problem List There are no problems to display for this patient.   Luretha Murphy. Ilsa Iha, PT, DPT 07/07/20, 2:28 PM  [place] [facility] PHYSICAL AND SPORTS MEDICINE [address] [place], [address] Phone: [phone]   Fax:  [phone]  Name: Linda Short MRN: [id number] Date of Birth: Apr 02, 1956

## 2020-07-08 ENCOUNTER — Encounter: Payer: 59 | Admitting: Physical Therapy

## 2020-07-09 ENCOUNTER — Ambulatory Visit: Payer: 59 | Admitting: Physical Therapy

## 2020-07-16 ENCOUNTER — Encounter: Payer: Self-pay | Admitting: Physical Therapy

## 2020-07-16 ENCOUNTER — Other Ambulatory Visit: Payer: Self-pay

## 2020-07-16 ENCOUNTER — Ambulatory Visit: Payer: 59 | Admitting: Physical Therapy

## 2020-07-16 DIAGNOSIS — M25512 Pain in left shoulder: Secondary | ICD-10-CM | POA: Diagnosis not present

## 2020-07-16 DIAGNOSIS — M6281 Muscle weakness (generalized): Secondary | ICD-10-CM

## 2020-07-16 NOTE — Therapy (Signed)
[facility] PHYSICAL AND SPORTS MEDICINE [address] [place], [address] Phone: [phone]   Fax:  [phone]  Physical Therapy Treatment  Patient Details  Name: Linda Short [facility] MRN: [id number] Date of Birth: [birth year] Referring Provider (PT): Rodolph Bong, MD   Encounter Date: 07/16/2020   PT End of Session - 07/16/20 1641    Visit Number 3    Number of Visits 24    Date for PT Re-Evaluation 09/24/20    Authorization Type [employer] reporting period from 07/02/2020    Progress Note Due on Visit 10    PT Start Time 1631    PT Stop Time 1700    PT Time Calculation (min) 29 min    Activity Tolerance Patient tolerated treatment well    Behavior During Therapy [facility] for tasks assessed/performed           History reviewed. No pertinent past medical history.  History reviewed. No pertinent surgical history.  There were no vitals filed for this visit.   Subjective Assessment - 07/16/20 1633    Subjective Patient reports she is feeling well today with no pain. Feels like she can raise her arm more to the front and side but still tight in the back. No exessive pain or soreness following last session. HEP is going so so because she states she is going only every other day but no problems when doing them .    Pertinent History Patient is a 64 y.o. female who presents to outpatient physical therapy with a referral for medical diagnosis acute pain of left shoulder. This patient's chief complaints consist of left shoulder and arm pain and lack of ROM leading to the following functional deficits: difficulty sleeping, lifting things overhead, reaching behind back and out to grab something, using L UE over head, grooming, dressing, swimming in the ocean, exercising for weight loss and fitness (especially working out arms). Relevant past medical history and comorbidities include hypertension, obesity. Patient denies hx of cancer, stroke,  seizures, lung problem, major cardiac events, diabetes, unexplained weight loss, changes in bowel or bladder problems, new onset stumbling or dropping things, prolonged use of steroid medication. Bone density scan a few years ago was fine. She has not had COVID19 that she knows of and has had two Pfizer COVID19 vaccinations.    Limitations Lifting;Other (comment)   leeping, lifting things overhead, reaching behind back and out to grab something, using L UE over head, grooming, dressing, swimming in the ocean, exercising for weight loss and fitness (especially working out arms).   Diagnostic tests 06/20/2020 Diagnostic Limited MSK Ultrasound of: Left shoulderBiceps tendon intact normal-appearingSubscapularis tendon intact normal.Supraspinatus tendon intact moderate increased subacromial bursa thickness present.Infraspinatus tendon normal-appearingAC joint degenerative narrowed with effusion.Impression: Subacromial bursitis and AC DJD    Patient Stated Goals reduce the pain and get full range of motion and function    Currently in Pain? No/denies    Pain Onset More than a month ago            TREATMENT: denies sensitivity to latex  Therapeutic exercise:to centralize symptoms and improve ROM, strength, muscular endurance, and activity tolerance required for successful completion of functional activities. - standing scapular row with cable 1x20@20 #, 1x10 with black theraband, 1x20@#25 cable - seated B shoulder ER against blue theraband 2x 10  - Standing AAROM left shoulder flexion 2x10 (trial of scaption included). 5 second holds   Circuit: - standing L shoulder ER with towel roll under  arm, 3x15 with red theraband (attempted green but too tough) - standing L shoulder IR with towel roll under arm, 3x20/15/15 with blue theraband  - standing B shoulder lateral raise to ~90 degrees abduction (as allowed by pain), thumb forward, 3x10   - Education on HEP including handout   Pt required  multimodal cuing for proper technique and to facilitate improved neuromuscular control, strength, range of motion, and functional ability resulting in improved performance and form.   HOME EXERCISE PROGRAM Access Code: [email or web address] URL: [email or web address] Date: 07/16/2020 Prepared by: Norton Blizzard  Exercises Shoulder Flexion Wall Slide with Towel - 2 x daily - 20 reps - 5 second hold Shoulder External Rotation with Resistance - 1 x daily - 3 sets - 10 reps - 5 seconds hold Row with band/cable - 1 x daily - 3 sets - 10 reps - 5 seconds hold Seated Shoulder Abduction - Palms Down - 1 x daily - 3 sets - 10 reps     PT Education - 07/16/20 1641    Education Details Exercise purpose/form. Self management techniques    Person(s) Educated Patient    Methods Explanation;Demonstration;Tactile cues;Verbal cues    Comprehension Verbalized understanding;Returned demonstration;Verbal cues required;Tactile cues required;Need further instruction            PT Short Term Goals - 07/07/20 1415      PT SHORT TERM GOAL #1   Title Be independent with initial home exercise program for self-management of symptoms.    Baseline Initial HEP provided at IE (07/02/2020);    Time 2    Period Weeks    Status Achieved    Target Date 07/16/20             PT Long Term Goals - 07/02/20 1816      PT LONG TERM GOAL #1   Title Be independent with a long-term home exercise program for self-management of symptoms.    Baseline Initial HEP provided at IE (07/02/2020);    Time 12    Period Weeks    Status New   TARGET DATE FOR ALL LONG TERM GOIALS: 09/24/2020     PT LONG TERM GOAL #2   Title Demonstrate improved FOTO score to equal or greater than 69 by visit #9 to demonstrate improvement in overall condition and self-reported functional ability.    Baseline 62 (07/02/2020);    Time 12    Period Weeks    Status New      PT LONG TERM GOAL #3   Title Have full left shoulder AROM with no  compensations or increase in pain in all planes except intermittent end range discomfort to allow patient to complete valued activities with less difficulty.    Baseline Limited and painful - see abjective exam (07/02/2020);    Time 12    Period Weeks    Status New      PT LONG TERM GOAL #4   Title Improve left shoulder strength to 4+/5 with no increase in pain for improved ability to allow patient to complete valued functional tasks such as reaching overhead and dressing with less difficulty.    Baseline weak and painful - see objective exam (07/02/2020);    Time 12    Period Weeks    Status New      PT LONG TERM GOAL #5   Title Complete community, work and/or recreational activities without limitation due to current condition.    Baseline Functional Limitations: sleeping, lifting things overhead, reaching behind back and  out to grab something, using L UE over head, grooming, dressing, swimming in the ocean, exercising for weight loss and fitness (especially working out arms) (07/02/2020);    Time 12    Period Weeks    Status New                 Plan - 07/16/20 1958    Clinical Impression Statement Patient tolerated treatment well and was able to advance exercises slightly. Updated HEP to reflect advancements. Patient had temporary discomfort with shoulder abduction  exercise but pain resolved each rep and afterwards. Educated carefully about acceptable discomfort and how to modify exercises in different pain situations at home. Patient shows improvement in AROM but is still quite limited.  Patient would benefit from continued management of limiting condition by skilled physical therapist to address remaining impairments and functional limitations to work towards stated goals and return to PLOF or maximal functional independence.    Personal Factors and Comorbidities Age;Comorbidity 2;Profession;Past/Current Experience;Time since onset of injury/illness/exacerbation    Comorbidities  Relevant past medical history and comorbidities include hypertension, obesity.    Examination-Activity Limitations Bathing;Bed Mobility;Hygiene/Grooming;Lift;Reach Overhead;Sleep;Dressing    Examination-Participation Restrictions Other   sleeping, lifting things overhead, reaching behind back and out to grab something, using L UE over head, grooming, dressing, swimming in the ocean, exercising for weight loss and fitness (especially working out arms).   Stability/Clinical Decision Making Stable/Uncomplicated    Rehab Potential Excellent    PT Frequency 2x / week    PT Duration 12 weeks    PT Treatment/Interventions ADLs/Self Care Home Management;Aquatic Therapy;Cryotherapy;Moist Heat;Therapeutic activities;Therapeutic exercise;Neuromuscular re-education;Patient/family education;Manual techniques;Dry needling;Passive range of motion;Taping;Joint Manipulations;Spinal Manipulations    PT Next Visit Plan update HEP as appropriate, progressive strengthening and ROM as tolerated    PT Home Exercise Plan Medbridge  Access Code: [email or web address]    Consulted and Agree with Plan of Care Patient           Patient will benefit from skilled therapeutic intervention in order to improve the following deficits and impairments:  Pain, Increased muscle spasms, Decreased activity tolerance, Decreased endurance, Decreased range of motion, Decreased strength, Hypomobility, Impaired UE functional use, Impaired perceived functional ability, Impaired flexibility  Visit Diagnosis: Left shoulder pain, unspecified chronicity  Muscle weakness (generalized)     Problem List There are no problems to display for this patient.   Luretha Murphy. Ilsa Iha, PT, DPT 07/16/20, 7:59 PM   [facility] PHYSICAL AND SPORTS MEDICINE [address], [place], [address] Phone: [phone]   Fax:  [phone]  Name: Linda Short MRN: [id number] Date of Birth: 11-14-1955

## 2020-07-21 ENCOUNTER — Other Ambulatory Visit: Payer: Self-pay

## 2020-07-21 ENCOUNTER — Ambulatory Visit: Payer: 59 | Attending: Family Medicine | Admitting: Physical Therapy

## 2020-07-21 ENCOUNTER — Encounter: Payer: Self-pay | Admitting: Physical Therapy

## 2020-07-21 DIAGNOSIS — M25512 Pain in left shoulder: Secondary | ICD-10-CM | POA: Diagnosis not present

## 2020-07-21 DIAGNOSIS — M6281 Muscle weakness (generalized): Secondary | ICD-10-CM | POA: Insufficient documentation

## 2020-07-21 NOTE — Therapy (Signed)
[facility] PHYSICAL AND SPORTS MEDICINE [address] [place], [address] Phone: [phone]   Fax:  [phone]  Physical Therapy Treatment  Patient Details  Name: Linda Short [facility] MRN: [id number] Date of Birth: 06-16-1956 Referring Provider (PT): Rodolph Bong, MD   Encounter Date: 07/21/2020   PT End of Session - 07/21/20 1441    Visit Number 4    Number of Visits 24    Date for PT Re-Evaluation 09/24/20    Authorization Type [employer] reporting period from 07/02/2020    Progress Note Due on Visit 10    PT Start Time 1441    PT Stop Time 1510    PT Time Calculation (min) 29 min    Activity Tolerance Patient tolerated treatment well    Behavior During Therapy [facility] for tasks assessed/performed           History reviewed. No pertinent past medical history.  History reviewed. No pertinent surgical history.  There were no vitals filed for this visit.   Subjective Assessment - 07/21/20 1441    Subjective Pateint reports she had some increaed pain following last treatment session and when she woke up and slept on the couch to help keep it in the same position. No pain at rest currently but still painful when she reaches into abduction. Was able to go back to her usual HEP yesterday. She avoided all HEP since she last was at PT.    Pertinent History Patient is a 64 y.o. female who presents to outpatient physical therapy with a referral for medical diagnosis acute pain of left shoulder. This patient's chief complaints consist of left shoulder and arm pain and lack of ROM leading to the following functional deficits: difficulty sleeping, lifting things overhead, reaching behind back and out to grab something, using L UE over head, grooming, dressing, swimming in the ocean, exercising for weight loss and fitness (especially working out arms). Relevant past medical history and comorbidities include hypertension, obesity. Patient  denies hx of cancer, stroke, seizures, lung problem, major cardiac events, diabetes, unexplained weight loss, changes in bowel or bladder problems, new onset stumbling or dropping things, prolonged use of steroid medication. Bone density scan a few years ago was fine. She has not had COVID19 that she knows of and has had two Pfizer COVID19 vaccinations.    Limitations Lifting;Other (comment)   leeping, lifting things overhead, reaching behind back and out to grab something, using L UE over head, grooming, dressing, swimming in the ocean, exercising for weight loss and fitness (especially working out arms).   Diagnostic tests 06/20/2020 Diagnostic Limited MSK Ultrasound of: Left shoulderBiceps tendon intact normal-appearingSubscapularis tendon intact normal.Supraspinatus tendon intact moderate increased subacromial bursa thickness present.Infraspinatus tendon normal-appearingAC joint degenerative narrowed with effusion.Impression: Subacromial bursitis and AC DJD    Patient Stated Goals reduce the pain and get full range of motion and function    Currently in Pain? No/denies    Pain Onset More than a month ago           TREATMENT: denies sensitivity to latex  Therapeutic exercise:to centralize symptoms and improve ROM, strength, muscular endurance, and activity tolerance required for successful completion of functional activities. - sidelying L shoulder ER with towel roll under arm, 1x10 with 3#DB, 2x10 with 2#DB.  - sidelying L shoulder abduction AROM in tolerated range, 3x10 - Education on HEP including handout   Pt required multimodal cuing for proper technique and to facilitate improved neuromuscular control,  strength, range of motion, and functional ability resulting in improved performance and form.  Manual therapy: to reduce pain and tissue tension, improve range of motion, neuromodulation, in order to promote improved ability to complete functional activities. - hooklying PROM L  shoulder flexion x 15, abduction x5 (painful), ER x 10 - hooklying L GHJ mobilizations grades II-III posterior, caudal in neutral, caudal as moving to flexion and abduction and in abduction.   HOME EXERCISE PROGRAM Access Code: [email or web address] URL: [email or web address] Date: 07/21/2020 Prepared by: Norton Blizzard  Exercises Shoulder Flexion Wall Slide with Towel - 2 x daily - 20 reps - 5 second hold Supine Shoulder Flexion Extension AAROM with [place] - 2 x daily - 20 reps - 5 seconds hold Row with band/cable - 1 x daily - 3 sets - 10 reps - 5 seconds hold Sidelying Shoulder External Rotation with Dumbbell - 1 x daily - 3 sets - 10 reps Sidelying Shoulder Abduction Palm Forward - 1 x daily - 3 sets - 10 reps Shoulder External Rotation with Resistance - 1 x daily - 3 sets - 10 reps - 5 seconds hold    PT Education - 07/21/20 1447    Education Details Exercise purpose/form. Self management techniques    Person(s) Educated Patient    Methods Explanation;Demonstration;Tactile cues;Verbal cues    Comprehension Verbalized understanding;Returned demonstration;Verbal cues required;Tactile cues required;Need further instruction            PT Short Term Goals - 07/07/20 1415      PT SHORT TERM GOAL #1   Title Be independent with initial home exercise program for self-management of symptoms.    Baseline Initial HEP provided at IE (07/02/2020);    Time 2    Period Weeks    Status Achieved    Target Date 07/16/20             PT Long Term Goals - 07/02/20 1816      PT LONG TERM GOAL #1   Title Be independent with a long-term home exercise program for self-management of symptoms.    Baseline Initial HEP provided at IE (07/02/2020);    Time 12    Period Weeks    Status New   TARGET DATE FOR ALL LONG TERM GOIALS: 09/24/2020     PT LONG TERM GOAL #2   Title Demonstrate improved FOTO score to equal or greater than 69 by visit #9 to demonstrate improvement in overall condition and  self-reported functional ability.    Baseline 62 (07/02/2020);    Time 12    Period Weeks    Status New      PT LONG TERM GOAL #3   Title Have full left shoulder AROM with no compensations or increase in pain in all planes except intermittent end range discomfort to allow patient to complete valued activities with less difficulty.    Baseline Limited and painful - see abjective exam (07/02/2020);    Time 12    Period Weeks    Status New      PT LONG TERM GOAL #4   Title Improve left shoulder strength to 4+/5 with no increase in pain for improved ability to allow patient to complete valued functional tasks such as reaching overhead and dressing with less difficulty.    Baseline weak and painful - see objective exam (07/02/2020);    Time 12    Period Weeks    Status New      PT LONG TERM GOAL #5   Title  Complete community, work and/or recreational activities without limitation due to current condition.    Baseline Functional Limitations: sleeping, lifting things overhead, reaching behind back and out to grab something, using L UE over head, grooming, dressing, swimming in the ocean, exercising for weight loss and fitness (especially working out arms) (07/02/2020);    Time 12    Period Weeks    Status New                 Plan - 07/21/20 1553    Clinical Impression Statement Patient tolerated treatment well overall with improved tolerance in sidelying position. Updated HEP to reflect this. Pt reported feeling slightly better by end of session. Patient would benefit from continued management of limiting condition by skilled physical therapist to address remaining impairments and functional limitations to work towards stated goals and return to PLOF or maximal functional independence.    Personal Factors and Comorbidities Age;Comorbidity 2;Profession;Past/Current Experience;Time since onset of injury/illness/exacerbation    Comorbidities Relevant past medical history and  comorbidities include hypertension, obesity.    Examination-Activity Limitations Bathing;Bed Mobility;Hygiene/Grooming;Lift;Reach Overhead;Sleep;Dressing    Examination-Participation Restrictions Other   sleeping, lifting things overhead, reaching behind back and out to grab something, using L UE over head, grooming, dressing, swimming in the ocean, exercising for weight loss and fitness (especially working out arms).   Stability/Clinical Decision Making Stable/Uncomplicated    Rehab Potential Excellent    PT Frequency 2x / week    PT Duration 12 weeks    PT Treatment/Interventions ADLs/Self Care Home Management;Aquatic Therapy;Cryotherapy;Moist Heat;Therapeutic activities;Therapeutic exercise;Neuromuscular re-education;Patient/family education;Manual techniques;Dry needling;Passive range of motion;Taping;Joint Manipulations;Spinal Manipulations    PT Next Visit Plan update HEP as appropriate, progressive strengthening and ROM as tolerated    PT Home Exercise Plan Medbridge  Access Code: [email or web address]    Consulted and Agree with Plan of Care Patient           Patient will benefit from skilled therapeutic intervention in order to improve the following deficits and impairments:  Pain, Increased muscle spasms, Decreased activity tolerance, Decreased endurance, Decreased range of motion, Decreased strength, Hypomobility, Impaired UE functional use, Impaired perceived functional ability, Impaired flexibility  Visit Diagnosis: Left shoulder pain, unspecified chronicity  Muscle weakness (generalized)     Problem List There are no problems to display for this patient.   Luretha Murphy. Ilsa Iha, PT, DPT 07/21/20, 3:55 PM  [facility] PHYSICAL AND SPORTS MEDICINE [address], [place], [address] Phone: [phone]   Fax:  [phone]  Name: Linda Short MRN: [id number] Date of Birth: August 09, 1956

## 2020-07-23 ENCOUNTER — Ambulatory Visit: Payer: 59 | Admitting: Physical Therapy

## 2020-07-24 ENCOUNTER — Other Ambulatory Visit: Payer: Self-pay

## 2020-07-24 ENCOUNTER — Encounter: Payer: Self-pay | Admitting: Physical Therapy

## 2020-07-24 ENCOUNTER — Ambulatory Visit: Payer: 59 | Admitting: Physical Therapy

## 2020-07-24 DIAGNOSIS — M25512 Pain in left shoulder: Secondary | ICD-10-CM

## 2020-07-24 DIAGNOSIS — M6281 Muscle weakness (generalized): Secondary | ICD-10-CM

## 2020-07-24 NOTE — Therapy (Signed)
[facility] PHYSICAL AND SPORTS MEDICINE [address], [place], [address] Phone: [phone]   Fax:  [phone]  Physical Therapy Treatment  Patient Details  Name: Linda Short [facility] MRN: [id number] Date of Birth: April 19, 1956 Referring Provider (PT): Rodolph Bong, MD   Encounter Date: 07/24/2020   PT End of Session - 07/24/20 1700    Visit Number 5    Number of Visits 24    Date for PT Re-Evaluation 09/24/20    Authorization Type [employer] reporting period from 07/02/2020    Progress Note Due on Visit 10    PT Start Time 1651    PT Stop Time 1730    PT Time Calculation (min) 39 min    Activity Tolerance Patient tolerated treatment well    Behavior During Therapy [facility] for tasks assessed/performed           History reviewed. No pertinent past medical history.  History reviewed. No pertinent surgical history.  There were no vitals filed for this visit.   Subjective Assessment - 07/24/20 1656    Subjective Patient reports her pain is 1/10 mostly when she moves it. She felt better after her monday PT appointment and did not have increased pain following that appointment. She has been very busy and mosly just did the slides in the shower.    Pertinent History Patient is a 64 y.o. female who presents to outpatient physical therapy with a referral for medical diagnosis acute pain of left shoulder. This patient's chief complaints consist of left shoulder and arm pain and lack of ROM leading to the following functional deficits: difficulty sleeping, lifting things overhead, reaching behind back and out to grab something, using L UE over head, grooming, dressing, swimming in the ocean, exercising for weight loss and fitness (especially working out arms). Relevant past medical history and comorbidities include hypertension, obesity. Patient denies hx of cancer, stroke, seizures, lung problem, major cardiac events, diabetes, unexplained  weight loss, changes in bowel or bladder problems, new onset stumbling or dropping things, prolonged use of steroid medication. Bone density scan a few years ago was fine. She has not had COVID19 that she knows of and has had two Pfizer COVID19 vaccinations.    Limitations Lifting;Other (comment)   leeping, lifting things overhead, reaching behind back and out to grab something, using L UE over head, grooming, dressing, swimming in the ocean, exercising for weight loss and fitness (especially working out arms).   Diagnostic tests 06/20/2020 Diagnostic Limited MSK Ultrasound of: Left shoulderBiceps tendon intact normal-appearingSubscapularis tendon intact normal.Supraspinatus tendon intact moderate increased subacromial bursa thickness present.Infraspinatus tendon normal-appearingAC joint degenerative narrowed with effusion.Impression: Subacromial bursitis and AC DJD    Patient Stated Goals reduce the pain and get full range of motion and function    Currently in Pain? Yes    Pain Score 1     Pain Location Shoulder    Pain Orientation Left    Pain Onset More than a month ago           TREATMENT: denies sensitivity to latex  Therapeutic exercise:to centralize symptoms and improve ROM, strength, muscular endurance, and activity tolerance required for successful completion of functional activities. - sidelying L shoulder ER with towel roll under arm, 3x10 with 3#DB.  - sidelying L shoulder abduction AROM in tolerated range, 3x10 - hooklying chest press with serratus plus with DB in each hand, 3x10 with 3#DB.  - standing scapular row with 15# - standing B  shoulder extension with overhead anchor, 3x10 with green theraband .  Pt required multimodal cuing for proper technique and to facilitate improved neuromuscular control, strength, range of motion, and functional ability resulting in improved performance and form.  HOME EXERCISE PROGRAM Access Code: [email or web address] URL:  [email or web address] Date: 07/21/2020 Prepared by: Norton Blizzard  Exercises Shoulder Flexion Wall Slide with Towel - 2 x daily - 20 reps - 5 second hold Supine Shoulder Flexion Extension AAROM with Dowel - 2 x daily - 20 reps - 5 seconds hold Row with band/cable - 1 x daily - 3 sets - 10 reps - 5 seconds hold Sidelying Shoulder External Rotation with Dumbbell - 1 x daily - 3 sets - 10 reps Sidelying Shoulder Abduction Palm Forward - 1 x daily - 3 sets - 10 reps Shoulder External Rotation with Resistance - 1 x daily - 3 sets - 10 reps - 5 seconds hold     PT Education - 07/24/20 1700    Education Details Exercise purpose/form. Self management techniques    Person(s) Educated Patient    Methods Explanation;Tactile cues;Demonstration;Verbal cues    Comprehension Verbalized understanding;Returned demonstration;Verbal cues required;Tactile cues required;Need further instruction            PT Short Term Goals - 07/07/20 1415      PT SHORT TERM GOAL #1   Title Be independent with initial home exercise program for self-management of symptoms.    Baseline Initial HEP provided at IE (07/02/2020);    Time 2    Period Weeks    Status Achieved    Target Date 07/16/20             PT Long Term Goals - 07/02/20 1816      PT LONG TERM GOAL #1   Title Be independent with a long-term home exercise program for self-management of symptoms.    Baseline Initial HEP provided at IE (07/02/2020);    Time 12    Period Weeks    Status New   TARGET DATE FOR ALL LONG TERM GOIALS: 09/24/2020     PT LONG TERM GOAL #2   Title Demonstrate improved FOTO score to equal or greater than 69 by visit #9 to demonstrate improvement in overall condition and self-reported functional ability.    Baseline 62 (07/02/2020);    Time 12    Period Weeks    Status New      PT LONG TERM GOAL #3   Title Have full left shoulder AROM with no compensations or increase in pain in all planes except  intermittent end range discomfort to allow patient to complete valued activities with less difficulty.    Baseline Limited and painful - see abjective exam (07/02/2020);    Time 12    Period Weeks    Status New      PT LONG TERM GOAL #4   Title Improve left shoulder strength to 4+/5 with no increase in pain for improved ability to allow patient to complete valued functional tasks such as reaching overhead and dressing with less difficulty.    Baseline weak and painful - see objective exam (07/02/2020);    Time 12    Period Weeks    Status New      PT LONG TERM GOAL #5   Title Complete community, work and/or recreational activities without limitation due to current condition.    Baseline Functional Limitations: sleeping, lifting things overhead, reaching behind back and out to grab something, using L UE over head,  grooming, dressing, swimming in the ocean, exercising for weight loss and fitness (especially working out arms) (07/02/2020);    Time 12    Period Weeks    Status New                 Plan - 07/24/20 1936    Clinical Impression Statement Patient tolerated treatment well and was able to continue shoulder strengthening at or below 90 degrees elevation without provocation of symptoms. Patient is still sensitive to overhead activities and has not returned to prior level of function. Patient would benefit from continued management of limiting condition by skilled physical therapist to address remaining impairments and functional limitations to work towards stated goals and return to PLOF or maximal functional independence.    Personal Factors and Comorbidities Age;Comorbidity 2;Profession;Past/Current Experience;Time since onset of injury/illness/exacerbation    Comorbidities Relevant past medical history and comorbidities include hypertension, obesity.    Examination-Activity Limitations Bathing;Bed Mobility;Hygiene/Grooming;Lift;Reach Overhead;Sleep;Dressing     Examination-Participation Restrictions Other   sleeping, lifting things overhead, reaching behind back and out to grab something, using L UE over head, grooming, dressing, swimming in the ocean, exercising for weight loss and fitness (especially working out arms).   Stability/Clinical Decision Making Stable/Uncomplicated    Rehab Potential Excellent    PT Frequency 2x / week    PT Duration 12 weeks    PT Treatment/Interventions ADLs/Self Care Home Management;Aquatic Therapy;Cryotherapy;Moist Heat;Therapeutic activities;Therapeutic exercise;Neuromuscular re-education;Patient/family education;Manual techniques;Dry needling;Passive range of motion;Taping;Joint Manipulations;Spinal Manipulations    PT Next Visit Plan update HEP as appropriate, progressive strengthening and ROM as tolerated    PT Home Exercise Plan Medbridge  Access Code: [email or web address]    Consulted and Agree with Plan of Care Patient           Patient will benefit from skilled therapeutic intervention in order to improve the following deficits and impairments:  Pain, Increased muscle spasms, Decreased activity tolerance, Decreased endurance, Decreased range of motion, Decreased strength, Hypomobility, Impaired UE functional use, Impaired perceived functional ability, Impaired flexibility  Visit Diagnosis: Left shoulder pain, unspecified chronicity  Muscle weakness (generalized)     Problem List There are no problems to display for this patient.  Luretha Murphy. Ilsa Iha, PT, DPT 07/24/20, 7:37 PM  [facility] PHYSICAL AND SPORTS MEDICINE [address] [place], [address] Phone: [phone]   Fax:  [phone]  Name: Shahrzad Koble MRN: [id number] Date of Birth: 25-Aug-1956

## 2020-07-28 ENCOUNTER — Other Ambulatory Visit: Payer: Self-pay

## 2020-07-28 ENCOUNTER — Encounter: Payer: Self-pay | Admitting: Physical Therapy

## 2020-07-28 ENCOUNTER — Ambulatory Visit: Payer: 59 | Admitting: Physical Therapy

## 2020-07-28 DIAGNOSIS — M6281 Muscle weakness (generalized): Secondary | ICD-10-CM

## 2020-07-28 DIAGNOSIS — M25512 Pain in left shoulder: Secondary | ICD-10-CM | POA: Diagnosis not present

## 2020-07-28 NOTE — Therapy (Signed)
[facility] PHYS[facility]ORTS MEDICINE [address], [place], [address] Phone: [phone]   Fax:  [phone]  Physical Therapy Treatment  Patient Details  Name: Linda Short [employer] MRN: [id number] Date of Birth: [birth year] Referring Provider (PT): Rodolph Bong, MD   Encounter Date: 07/28/2020   PT End of Session - 07/28/20 1445    Visit Number 6    Number of Visits 24    Date for PT Re-Evaluation 09/24/20    Authorization Type [employer] reporting period from 07/02/2020    Progress Note Due on Visit 10    PT Start Time 1438    PT Stop Time 1518    PT Time Calculation (min) 40 min    Activity Tolerance Patient tolerated treatment well    Behavior During Therapy [facility] for tasks assessed/performed           History reviewed. No pertinent past medical history.  History reviewed. No pertinent surgical history.  There were no vitals filed for this visit.   Subjective Assessment - 07/28/20 1436    Subjective Patient reports she has no pain upon arrival and was a bit achy but no exessive soreness following last treatment session. Feels like she is able to move arm better but not back to normal.    Pertinent History Patient is a 64 y.o. female who presents to outpatient physical therapy with a referral for medical diagnosis acute pain of left shoulder. This patient's chief complaints consist of left shoulder and arm pain and lack of ROM leading to the following functional deficits: difficulty sleeping, lifting things overhead, reaching behind back and out to grab something, using L UE over head, grooming, dressing, swimming in the ocean, exercising for weight loss and fitness (especially working out arms). Relevant past medical history and comorbidities include hypertension, obesity. Patient denies hx of cancer, stroke, seizures, lung problem, major cardiac events, diabetes, unexplained weight loss, changes in bowel or bladder  problems, new onset stumbling or dropping things, prolonged use of steroid medication. Bone density scan a few years ago was fine. She has not had COVID19 that she knows of and has had two Pfizer COVID19 vaccinations.    Limitations Lifting;Other (comment)   leeping, lifting things overhead, reaching behind back and out to grab something, using L UE over head, grooming, dressing, swimming in the ocean, exercising for weight loss and fitness (especially working out arms).   Diagnostic tests 06/20/2020 Diagnostic Limited MSK Ultrasound of: Left shoulderBiceps tendon intact normal-appearingSubscapularis tendon intact normal.Supraspinatus tendon intact moderate increased subacromial bursa thickness present.Infraspinatus tendon normal-appearingAC joint degenerative narrowed with effusion.Impression: Subacromial bursitis and AC DJD    Patient Stated Goals reduce the pain and get full range of motion and function    Currently in Pain? No/denies    Pain Onset More than a month ago           TREATMENT: denies sensitivity to latex  Therapeutic exercise:to centralize symptoms and improve ROM, strength, muscular endurance, and activity tolerance required for successful completion of functional activities. - sidelying L shoulder ER with towel roll under arm, 1x10 with 4# DB, 2x15 with 3#DB.  -sidelying L shoulder abduction AROM in tolerated range, 3x10@0 /1/2#DB.  - hooklying chest press with serratus plus with DB in each hand, 3x10 with 5#DB.  - standing scapular row with 20# cable, 3x15 - standing B shoulder extension with overhead anchor, 3x10-15 with green theraband . - modified push up at TM bar, wall, and plinth.  Lower height more comfortable.   Manual therapy: to reduce pain and tissue tension, improve range of motion, neuromodulation, in order to promote improved ability to complete functional activities.  - hooklying STM to left shoulder region focusing on anterior and posterior shoulder, upper  trap/supraspinatus region, and biceps brachii. Mild tenderness over upper trap, otherwise asymptomatic.   Pt required multimodal cuing for proper technique and to facilitate improved neuromuscular control, strength, range of motion, and functional ability resulting in improved performance and form.  HOME EXERCISE PROGRAM Access Code: [email or web address] URL: [email or web address] Date: 07/28/2020 Prepared by: Norton Blizzard  Exercises Shoulder Flexion Wall Slide with Towel - 2 x daily - 20 reps - 5 second hold Row with band/cable - 1 x daily - 3 sets - 10 reps - 5 seconds hold Sidelying Shoulder External Rotation with Dumbbell - 1 x daily - 3 sets - 10 reps Sidelying Shoulder Abduction Palm Forward - 1 x daily - 3 sets - 10 reps Shoulder External Rotation with Resistance - 1 x daily - 3 sets - 10 reps - 5 seconds hold    PT Education - 07/28/20 1444    Education Details Exercise purpose/form. Self management techniques    Person(s) Educated Patient    Methods Explanation;Demonstration;Verbal cues;Tactile cues    Comprehension Verbalized understanding;Returned demonstration;Verbal cues required;Tactile cues required;Need further instruction            PT Short Term Goals - 07/07/20 1415      PT SHORT TERM GOAL #1   Title Be independent with initial home exercise program for self-management of symptoms.    Baseline Initial HEP provided at IE (07/02/2020);    Time 2    Period Weeks    Status Achieved    Target Date 07/16/20             PT Long Term Goals - 07/02/20 1816      PT LONG TERM GOAL #1   Title Be independent with a long-term home exercise program for self-management of symptoms.    Baseline Initial HEP provided at IE (07/02/2020);    Time 12    Period Weeks    Status New   TARGET DATE FOR ALL LONG TERM GOIALS: 09/24/2020     PT LONG TERM GOAL #2   Title Demonstrate improved FOTO score to equal or greater than 69 by visit #9 to demonstrate improvement in  overall condition and self-reported functional ability.    Baseline 62 (07/02/2020);    Time 12    Period Weeks    Status New      PT LONG TERM GOAL #3   Title Have full left shoulder AROM with no compensations or increase in pain in all planes except intermittent end range discomfort to allow patient to complete valued activities with less difficulty.    Baseline Limited and painful - see abjective exam (07/02/2020);    Time 12    Period Weeks    Status New      PT LONG TERM GOAL #4   Title Improve left shoulder strength to 4+/5 with no increase in pain for improved ability to allow patient to complete valued functional tasks such as reaching overhead and dressing with less difficulty.    Baseline weak and painful - see objective exam (07/02/2020);    Time 12    Period Weeks    Status New      PT LONG TERM GOAL #5   Title Complete community, work and/or recreational activities without  limitation due to current condition.    Baseline Functional Limitations: sleeping, lifting things overhead, reaching behind back and out to grab something, using L UE over head, grooming, dressing, swimming in the ocean, exercising for weight loss and fitness (especially working out arms) (07/02/2020);    Time 12    Period Weeks    Status New                 Plan - 07/28/20 1532    Clinical Impression Statement Patient tolerated treatment well and appears to be progressing nicely with gradually progressed exercises. Is not yet back to PLOF but currently satisfied with progress. Patient would benefit from continued management of limiting condition by skilled physical therapist to address remaining impairments and functional limitations to work towards stated goals and return to PLOF or maximal functional independence.    Personal Factors and Comorbidities Age;Comorbidity 2;Profession;Past/Current Experience;Time since onset of injury/illness/exacerbation    Comorbidities Relevant past medical  history and comorbidities include hypertension, obesity.    Examination-Activity Limitations Bathing;Bed Mobility;Hygiene/Grooming;Lift;Reach Overhead;Sleep;Dressing    Examination-Participation Restrictions Other   sleeping, lifting things overhead, reaching behind back and out to grab something, using L UE over head, grooming, dressing, swimming in the ocean, exercising for weight loss and fitness (especially working out arms).   Stability/Clinical Decision Making Stable/Uncomplicated    Rehab Potential Excellent    PT Frequency 2x / week    PT Duration 12 weeks    PT Treatment/Interventions ADLs/Self Care Home Management;Aquatic Therapy;Cryotherapy;Moist Heat;Therapeutic activities;Therapeutic exercise;Neuromuscular re-education;Patient/family education;Manual techniques;Dry needling;Passive range of motion;Taping;Joint Manipulations;Spinal Manipulations    PT Next Visit Plan update HEP as appropriate, progressive strengthening and ROM as tolerated    PT Home Exercise Plan Medbridge  Access Code: [email or web address]    Consulted and Agree with Plan of Care Patient           Patient will benefit from skilled therapeutic intervention in order to improve the following deficits and impairments:  Pain, Increased muscle spasms, Decreased activity tolerance, Decreased endurance, Decreased range of motion, Decreased strength, Hypomobility, Impaired UE functional use, Impaired perceived functional ability, Impaired flexibility  Visit Diagnosis: Left shoulder pain, unspecified chronicity  Muscle weakness (generalized)     Problem List There are no problems to display for this patient.   Luretha Murphy. Ilsa Iha, PT, DPT 07/28/20, 3:33 PM  [facility] PHYSICAL AND SPORTS MEDICINE [address], [place], [address] Phone: [phone]   Fax:  [phone]  Name: Patsie Mccardle MRN: [id number] Date of Birth: 1955/10/12

## 2020-07-30 ENCOUNTER — Ambulatory Visit: Payer: 59 | Admitting: Physical Therapy

## 2020-07-30 ENCOUNTER — Other Ambulatory Visit: Payer: Self-pay

## 2020-07-30 ENCOUNTER — Encounter: Payer: Self-pay | Admitting: Physical Therapy

## 2020-07-30 DIAGNOSIS — M25512 Pain in left shoulder: Secondary | ICD-10-CM | POA: Diagnosis not present

## 2020-07-30 DIAGNOSIS — M6281 Muscle weakness (generalized): Secondary | ICD-10-CM

## 2020-07-30 NOTE — Therapy (Signed)
[facility]OR[facility]INE [address], [place], [address] Phone: [phone]   Fax:  [phone]  Physical Therapy Treatment  Patient Details  Name: Linda Short [facility] MRN: [id number] Date of Birth: [birth year] Referring Provider (PT): Rodolph Bong, MD   Encounter Date: 07/30/2020   PT End of Session - 07/30/20 1105    Visit Number 7    Number of Visits 24    Date for PT Re-Evaluation 09/24/20    Authorization Type [employer] reporting period from 07/02/2020    Progress Note Due on Visit 10    PT Start Time 1032    PT Stop Time 1112    PT Time Calculation (min) 40 min    Activity Tolerance Patient tolerated treatment well    Behavior During Therapy [facility] for tasks assessed/performed           History reviewed. No pertinent past medical history.  History reviewed. No pertinent surgical history.  There were no vitals filed for this visit.   Subjective Assessment - 07/30/20 1947    Subjective Patient reports she continues to feel better over time and she is doing her exercises without increased pain.  She reports she felt fine following last treatment session.  She has been sleeping better and awaking with less pain.  Patient states she feels comfortable with going to 1 session per week.    Pertinent History Patient is a 64 y.o. female who presents to outpatient physical therapy with a referral for medical diagnosis acute pain of left shoulder. This patient's chief complaints consist of left shoulder and arm pain and lack of ROM leading to the following functional deficits: difficulty sleeping, lifting things overhead, reaching behind back and out to grab something, using L UE over head, grooming, dressing, swimming in the ocean, exercising for weight loss and fitness (especially working out arms). Relevant past medical history and comorbidities include hypertension, obesity. Patient denies hx of cancer, stroke,  seizures, lung problem, major cardiac events, diabetes, unexplained weight loss, changes in bowel or bladder problems, new onset stumbling or dropping things, prolonged use of steroid medication. Bone density scan a few years ago was fine. She has not had COVID19 that she knows of and has had two Pfizer COVID19 vaccinations.    Limitations Lifting;Other (comment)   leeping, lifting things overhead, reaching behind back and out to grab something, using L UE over head, grooming, dressing, swimming in the ocean, exercising for weight loss and fitness (especially working out arms).   Diagnostic tests 06/20/2020 Diagnostic Limited MSK Ultrasound of: Left shoulderBiceps tendon intact normal-appearingSubscapularis tendon intact normal.Supraspinatus tendon intact moderate increased subacromial bursa thickness present.Infraspinatus tendon normal-appearingAC joint degenerative narrowed with effusion.Impression: Subacromial bursitis and AC DJD    Patient Stated Goals reduce the pain and get full range of motion and function           TREATMENT: denies sensitivity to latex  Therapeutic exercise:to centralize symptoms and improve ROM, strength, muscular endurance, and activity tolerance required for successful completion of functional activities. - Upper body ergometer with no added resistance encourage joint nutrition, warm tissue, induce analgesic effect of aerobic exercise, improve muscular strength and endurance,  and prepare for remainder of session. Level 4, switching direction every 1 minute for 5 minutes. - sidelying L shoulder ER with towel roll under arm,3x15 with 3#DB. -sidelying L shoulder abduction AROM in tolerated range, 3x10@2 #DB.  - hooklying chest press with serratus plus band around back of body,  3x10 - standing scapular row with 20# cable, 3x15 - standing B shoulder extension with overhead anchor, 3x15 with green theraband.  - Education on HEP including handout   Pt required  multimodal cuing for proper technique and to facilitate improved neuromuscular control, strength, range of motion, and functional ability resulting in improved performance and form.  HOME EXERCISE PROGRAM Access Code: [email or web address] URL: [email or web address] Date: 07/30/2020 Prepared by: Norton Blizzard  Exercises Shoulder Flexion Wall Slide with Towel - 1 x daily - 20 reps - 5 second hold Row with band/cable - 3 x weekly - 3 sets - 10 reps - 5 seconds hold Shoulder Extension with Resistance - Palms Forward - 3 x weekly - 3 sets - 10-15 reps Shoulder External Rotation with Resistance - 3 x weekly - 3 sets - 10 reps - 5 seconds hold Sidelying Shoulder External Rotation with Dumbbell - 3 x weekly - 3 sets - 10 reps Sidelying Shoulder Abduction Palm Forward - 3 x weekly - 3 sets - 10 reps Supine Chest Press with Resistance - 3 x weekly - 3 sets - 10 reps      PT Education - 07/30/20 1105    Education Details Exercise purpose/form. Self management techniques    Person(s) Educated Patient    Methods Explanation;Demonstration;Tactile cues;Verbal cues    Comprehension Verbalized understanding;Returned demonstration;Verbal cues required;Tactile cues required;Need further instruction            PT Short Term Goals - 07/07/20 1415      PT SHORT TERM GOAL #1   Title Be independent with initial home exercise program for self-management of symptoms.    Baseline Initial HEP provided at IE (07/02/2020);    Time 2    Period Weeks    Status Achieved    Target Date 07/16/20             PT Long Term Goals - 07/02/20 1816      PT LONG TERM GOAL #1   Title Be independent with a long-term home exercise program for self-management of symptoms.    Baseline Initial HEP provided at IE (07/02/2020);    Time 12    Period Weeks    Status New   TARGET DATE FOR ALL LONG TERM GOIALS: 09/24/2020     PT LONG TERM GOAL #2   Title Demonstrate improved FOTO score to equal or greater than 69 by  visit #9 to demonstrate improvement in overall condition and self-reported functional ability.    Baseline 62 (07/02/2020);    Time 12    Period Weeks    Status New      PT LONG TERM GOAL #3   Title Have full left shoulder AROM with no compensations or increase in pain in all planes except intermittent end range discomfort to allow patient to complete valued activities with less difficulty.    Baseline Limited and painful - see abjective exam (07/02/2020);    Time 12    Period Weeks    Status New      PT LONG TERM GOAL #4   Title Improve left shoulder strength to 4+/5 with no increase in pain for improved ability to allow patient to complete valued functional tasks such as reaching overhead and dressing with less difficulty.    Baseline weak and painful - see objective exam (07/02/2020);    Time 12    Period Weeks    Status New      PT LONG TERM GOAL #5   Title Complete  community, work and/or recreational activities without limitation due to current condition.    Baseline Functional Limitations: sleeping, lifting things overhead, reaching behind back and out to grab something, using L UE over head, grooming, dressing, swimming in the ocean, exercising for weight loss and fitness (especially working out arms) (07/02/2020);    Time 12    Period Weeks    Status New                 Plan - 07/30/20 1112    Clinical Impression Statement Patient tolerated treatment well overall and exercises were adapted to a HEP for increased independence to allow patient to reduce frequency of in clinic visits to 1 x a week. Patient continues to progress nicely but has not yet returned to PLOF. Patient would benefit from continued management of limiting condition by skilled physical therapist to address remaining impairments and functional limitations to work towards stated goals and return to PLOF or maximal functional independence.    Personal Factors and Comorbidities Age;Comorbidity  2;Profession;Past/Current Experience;Time since onset of injury/illness/exacerbation    Comorbidities Relevant past medical history and comorbidities include hypertension, obesity.    Examination-Activity Limitations Bathing;Bed Mobility;Hygiene/Grooming;Lift;Reach Overhead;Sleep;Dressing    Examination-Participation Restrictions Other   sleeping, lifting things overhead, reaching behind back and out to grab something, using L UE over head, grooming, dressing, swimming in the ocean, exercising for weight loss and fitness (especially working out arms).   Stability/Clinical Decision Making Stable/Uncomplicated    Rehab Potential Excellent    PT Frequency 2x / week    PT Duration 12 weeks    PT Treatment/Interventions ADLs/Self Care Home Management;Aquatic Therapy;Cryotherapy;Moist Heat;Therapeutic activities;Therapeutic exercise;Neuromuscular re-education;Patient/family education;Manual techniques;Dry needling;Passive range of motion;Taping;Joint Manipulations;Spinal Manipulations    PT Next Visit Plan update HEP as appropriate, progressive strengthening and ROM as tolerated    PT Home Exercise Plan Medbridge  Access Code: [email or web address]    Consulted and Agree with Plan of Care Patient           Patient will benefit from skilled therapeutic intervention in order to improve the following deficits and impairments:  Pain, Increased muscle spasms, Decreased activity tolerance, Decreased endurance, Decreased range of motion, Decreased strength, Hypomobility, Impaired UE functional use, Impaired perceived functional ability, Impaired flexibility  Visit Diagnosis: Left shoulder pain, unspecified chronicity  Muscle weakness (generalized)     Problem List There are no problems to display for this patient.   Luretha Murphy. Ilsa Iha, PT, DPT 07/30/20, 7:49 PM  [facility] PHYSICAL AND SPORTS MEDICINE [address] [place], [address] Phone: [phone]   Fax:   [phone]  Name: Hillary Struss MRN: [id number] Date of Birth: [birth year]

## 2020-08-01 ENCOUNTER — Ambulatory Visit: Payer: 59 | Admitting: Family Medicine

## 2020-08-04 ENCOUNTER — Other Ambulatory Visit: Payer: Self-pay

## 2020-08-04 ENCOUNTER — Encounter: Payer: Self-pay | Admitting: Physical Therapy

## 2020-08-04 ENCOUNTER — Ambulatory Visit: Payer: 59 | Admitting: Physical Therapy

## 2020-08-04 DIAGNOSIS — M25512 Pain in left shoulder: Secondary | ICD-10-CM

## 2020-08-04 DIAGNOSIS — M6281 Muscle weakness (generalized): Secondary | ICD-10-CM

## 2020-08-04 NOTE — Therapy (Signed)
[place] [facility] PHYS[facility]ORTS MEDICINE [address] [place], [address] Phone: [phone]   Fax:  [phone]  Physical Therapy Treatment  Patient Details  Name: Linda Short [facility] MRN: [id number] Date of Birth: 1956-06-07 Referring Provider (PT): Rodolph Bong, MD   Encounter Date: 08/04/2020   PT End of Session - 08/04/20 1443    Visit Number 8    Number of Visits 24    Date for PT Re-Evaluation 09/24/20    Authorization Type [employer] reporting period from 07/02/2020    Progress Note Due on Visit 10    PT Start Time 1435    PT Stop Time 1515    PT Time Calculation (min) 40 min    Activity Tolerance Patient tolerated treatment well    Behavior During Therapy [facility] for tasks assessed/performed           History reviewed. No pertinent past medical history.  History reviewed. No pertinent surgical history.  There were no vitals filed for this visit.   Subjective Assessment - 08/04/20 1437    Subjective Patient reports her left shoulder is continuing to get better. She forgot it was bothering her and she reached into a cupboard and it hurt for a minute but most things are going a lot better. Even sleeping is better. no pain currently. The only other thing she is still kind of struggling with is getting it behind her when dressing.    Pertinent History Patient is a 64 y.o. female who presents to outpatient physical therapy with a referral for medical diagnosis acute pain of left shoulder. This patient's chief complaints consist of left shoulder and arm pain and lack of ROM leading to the following functional deficits: difficulty sleeping, lifting things overhead, reaching behind back and out to grab something, using L UE over head, grooming, dressing, swimming in the ocean, exercising for weight loss and fitness (especially working out arms). Relevant past medical history and comorbidities include hypertension, obesity. Patient  denies hx of cancer, stroke, seizures, lung problem, major cardiac events, diabetes, unexplained weight loss, changes in bowel or bladder problems, new onset stumbling or dropping things, prolonged use of steroid medication. Bone density scan a few years ago was fine. She has not had COVID19 that she knows of and has had two Pfizer COVID19 vaccinations.    Limitations Lifting;Other (comment)   leeping, lifting things overhead, reaching behind back and out to grab something, using L UE over head, grooming, dressing, swimming in the ocean, exercising for weight loss and fitness (especially working out arms).   Diagnostic tests 06/20/2020 Diagnostic Limited MSK Ultrasound of: Left shoulderBiceps tendon intact normal-appearingSubscapularis tendon intact normal.Supraspinatus tendon intact moderate increased subacromial bursa thickness present.Infraspinatus tendon normal-appearingAC joint degenerative narrowed with effusion.Impression: Subacromial bursitis and AC DJD    Patient Stated Goals reduce the pain and get full range of motion and function    Currently in Pain? No/denies            TREATMENT: denies sensitivity to latex  Therapeutic exercise:to centralize symptoms and improve ROM, strength, muscular endurance, and activity tolerance required for successful completion of functional activities. - Upper body ergometerwith no added resistanceencourage joint nutrition, warm tissue, induce analgesic effect of aerobic exercise, improve muscular strength and endurance, and prepare for remainder of session. Level 4, switching direction every 1 minute for 5 minutes. - standing AAROM L shoulder functional IR, 2x10 with 5 second hold (added to HEP).  - sidelying L shoulder  ER with towel roll under arm,3x15 with 3#DB. -sidelying L shoulder abduction AROM in tolerated range,3x10@3 #DB. - hooklying chest press with serratus plus with 6#DB, 3x10 - standing scapular row with20# cable, 3x15 -  standing B shoulder extension with overhead anchor, 3x15with 10# cable. - Education on HEP including handout   Pt required multimodal cuing for proper technique and to facilitate improved neuromuscular control, strength, range of motion, and functional ability resulting in improved performance and form.  HOME EXERCISE PROGRAM Access Code: [email or web address] URL: [email or web address] Date: 08/04/2020 Prepared by: Norton Blizzard  Exercises Shoulder Flexion Wall Slide with Towel - 1 x daily - 20 reps - 5 second hold Standing Shoulder Internal Rotation Stretch with Towel - 1 x daily - 2 sets - 10 reps - 5 seconds hold Row with band/cable - 3 x weekly - 3 sets - 10 reps - 5 seconds hold Shoulder Extension with Resistance - Palms Forward - 3 x weekly - 3 sets - 10-15 reps Shoulder External Rotation with Resistance - 3 x weekly - 3 sets - 10 reps - 5 seconds hold Sidelying Shoulder External Rotation with Dumbbell - 3 x weekly - 3 sets - 10 reps  Sidelying Shoulder Abduction Palm Forward - 3 x weekly - 3 sets - 10 reps Supine Chest Press with Resistance - 3 x weekly - 3 sets - 10 reps      PT Education - 08/04/20 1442    Education Details Exercise purpose/form. Self management techniques    Person(s) Educated Patient    Methods Explanation;Demonstration;Tactile cues;Verbal cues;Handout    Comprehension Verbalized understanding;Returned demonstration;Verbal cues required;Tactile cues required;Need further instruction            PT Short Term Goals - 07/07/20 1415      PT SHORT TERM GOAL #1   Title Be independent with initial home exercise program for self-management of symptoms.    Baseline Initial HEP provided at IE (07/02/2020);    Time 2    Period Weeks    Status Achieved    Target Date 07/16/20             PT Long Term Goals - 07/02/20 1816      PT LONG TERM GOAL #1   Title Be independent with a long-term home exercise program for self-management of symptoms.     Baseline Initial HEP provided at IE (07/02/2020);    Time 12    Period Weeks    Status New   TARGET DATE FOR ALL LONG TERM GOIALS: 09/24/2020     PT LONG TERM GOAL #2   Title Demonstrate improved FOTO score to equal or greater than 69 by visit #9 to demonstrate improvement in overall condition and self-reported functional ability.    Baseline 62 (07/02/2020);    Time 12    Period Weeks    Status New      PT LONG TERM GOAL #3   Title Have full left shoulder AROM with no compensations or increase in pain in all planes except intermittent end range discomfort to allow patient to complete valued activities with less difficulty.    Baseline Limited and painful - see abjective exam (07/02/2020);    Time 12    Period Weeks    Status New      PT LONG TERM GOAL #4   Title Improve left shoulder strength to 4+/5 with no increase in pain for improved ability to allow patient to complete valued functional tasks such as reaching overhead and dressing  with less difficulty.    Baseline weak and painful - see objective exam (07/02/2020);    Time 12    Period Weeks    Status New      PT LONG TERM GOAL #5   Title Complete community, work and/or recreational activities without limitation due to current condition.    Baseline Functional Limitations: sleeping, lifting things overhead, reaching behind back and out to grab something, using L UE over head, grooming, dressing, swimming in the ocean, exercising for weight loss and fitness (especially working out arms) (07/02/2020);    Time 12    Period Weeks    Status New                 Plan - 08/04/20 1513    Clinical Impression Statement Patient continues to tolerate treatment well and is progressing nicely. Added FIR stretch due to continued difficulty reaching behind back. Able to slightly progress resistance. Patient would benefit from continued management of limiting condition by skilled physical therapist to address remaining impairments and  functional limitations to work towards stated goals and return to PLOF or maximal functional independence.    Personal Factors and Comorbidities Age;Comorbidity 2;Profession;Past/Current Experience;Time since onset of injury/illness/exacerbation    Comorbidities Relevant past medical history and comorbidities include hypertension, obesity.    Examination-Activity Limitations Bathing;Bed Mobility;Hygiene/Grooming;Lift;Reach Overhead;Sleep;Dressing    Examination-Participation Restrictions Other   sleeping, lifting things overhead, reaching behind back and out to grab something, using L UE over head, grooming, dressing, swimming in the ocean, exercising for weight loss and fitness (especially working out arms).   Stability/Clinical Decision Making Stable/Uncomplicated    Rehab Potential Excellent    PT Frequency 2x / week    PT Duration 12 weeks    PT Treatment/Interventions ADLs/Self Care Home Management;Aquatic Therapy;Cryotherapy;Moist Heat;Therapeutic activities;Therapeutic exercise;Neuromuscular re-education;Patient/family education;Manual techniques;Dry needling;Passive range of motion;Taping;Joint Manipulations;Spinal Manipulations    PT Next Visit Plan update HEP as appropriate, progressive strengthening and ROM as tolerated    PT Home Exercise Plan Medbridge  Access Code: [email or web address]    Consulted and Agree with Plan of Care Patient           Patient will benefit from skilled therapeutic intervention in order to improve the following deficits and impairments:  Pain, Increased muscle spasms, Decreased activity tolerance, Decreased endurance, Decreased range of motion, Decreased strength, Hypomobility, Impaired UE functional use, Impaired perceived functional ability, Impaired flexibility  Visit Diagnosis: Left shoulder pain, unspecified chronicity  Muscle weakness (generalized)     Problem List There are no problems to display for this patient.   Luretha Murphy. Ilsa Iha, PT, DPT 08/04/20,  3:13 PM  [place] [facility] PHYSICAL AND SPORTS MEDICINE [address], [place], [address] Phone: [phone]   Fax:  [phone]  Name: Linda Short MRN: [id number] Date of Birth: Mar 10, 1956

## 2020-08-06 ENCOUNTER — Encounter: Payer: 59 | Admitting: Physical Therapy

## 2020-08-11 ENCOUNTER — Encounter: Payer: Self-pay | Admitting: Physical Therapy

## 2020-08-11 ENCOUNTER — Other Ambulatory Visit: Payer: Self-pay

## 2020-08-11 ENCOUNTER — Ambulatory Visit: Payer: 59 | Admitting: Physical Therapy

## 2020-08-11 DIAGNOSIS — M25512 Pain in left shoulder: Secondary | ICD-10-CM

## 2020-08-11 DIAGNOSIS — M6281 Muscle weakness (generalized): Secondary | ICD-10-CM

## 2020-08-11 NOTE — Therapy (Signed)
[facility] PHYSICAL AND SPORTS MEDICINE [address] [place], [address] Phone: [phone]   Fax:  [phone]  Physical Therapy Treatment  Patient Details  Name: Linda Short [facility] MRN: [id number] Date of Birth: 03-03-1956 Referring Provider (PT): Rodolph Bong, MD   Encounter Date: 08/11/2020   PT End of Session - 08/11/20 1441    Visit Number 9    Number of Visits 24    Date for PT Re-Evaluation 09/24/20    Authorization Type [employer] reporting period from 07/02/2020    Progress Note Due on Visit 10    PT Start Time 1435    PT Stop Time 1515    PT Time Calculation (min) 40 min    Activity Tolerance Patient tolerated treatment well    Behavior During Therapy [facility] for tasks assessed/performed           History reviewed. No pertinent past medical history.  History reviewed. No pertinent surgical history.  There were no vitals filed for this visit.   Subjective Assessment - 08/11/20 1438    Subjective Patient reports she is feeling well today and only has pulling and discomfort when she reaches up and behind her. She is starting to forget that her arm hurts unless she does something that bothers it. Did not get to her exercises much but has been doing a lot of cleaning and housework and able to use both arms without pain. Continues to have limitations in overhead activities in abduction more than flexion and reaching behind back.    Pertinent History Patient is a 64 y.o. female who presents to outpatient physical therapy with a referral for medical diagnosis acute pain of left shoulder. This patient's chief complaints consist of left shoulder and arm pain and lack of ROM leading to the following functional deficits: difficulty sleeping, lifting things overhead, reaching behind back and out to grab something, using L UE over head, grooming, dressing, swimming in the ocean, exercising for weight loss and fitness (especially  working out arms). Relevant past medical history and comorbidities include hypertension, obesity. Patient denies hx of cancer, stroke, seizures, lung problem, major cardiac events, diabetes, unexplained weight loss, changes in bowel or bladder problems, new onset stumbling or dropping things, prolonged use of steroid medication. Bone density scan a few years ago was fine. She has not had COVID19 that she knows of and has had two Pf[employer] COVID19 vaccinations.    Limitations Lifting;Other (comment)   leeping, lifting things overhead, reaching behind back and out to grab something, using L UE over head, grooming, dressing, swimming in the ocean, exercising for weight loss and fitness (especially working out arms).   Diagnostic tests 06/20/2020 Diagnostic Limited MSK Ultrasound of: Left shoulderBiceps tendon intact normal-appearingSubscapularis tendon intact normal.Supraspinatus tendon intact moderate increased subacromial bursa thickness present.Infraspinatus tendon normal-appearingAC joint degenerative narrowed with effusion.Impression: Subacromial bursitis and AC DJD    Patient Stated Goals reduce the pain and get full range of motion and function    Currently in Pain? No/denies             TREATMENT: denies sensitivity to latex  Therapeutic exercise:to centralize symptoms and improve ROM, strength, muscular endurance, and activity tolerance required for successful completion of functional activities. -Upper body ergometerwith no added resistanceencourage joint nutrition, warm tissue, induce analgesic effect of aerobic exercise, improve muscular strength and endurance, and prepare for remainder of session.Level 4, switching direction every 1 minute for 5 minutes. (manual therapy - see below) -  standing AAROM L shoulder wall slide x 20 into scaption - sidelying L shoulder ER with towel roll under arm,3x15/10/10 with 3/4/4#DB. -sidelying L shoulder abduction AROM in tolerated  range,3x15@3 #DB. - hooklying chest press with serratus pluswith 9#DB, 3x10 - standing B scaption AROM, 3x10 - Education on HEP including handout   Manual therapy: to reduce pain and tissue tension, improve range of motion, neuromodulation, in order to promote improved ability to complete functional activities. - supine STM to soft tissue of L shoulder focusing mostly on posterior rotator cuff region.  - supine PROM flexion x 5 with end range discomfort. Attempted abduction but very limited by pain and guarding at 90 degrees.   Pt required multimodal cuing for proper technique and to facilitate improved neuromuscular control, strength, range of motion, and functional ability resulting in improved performance and form.  HOME EXERCISE PROGRAM Access Code: [email or web address] URL: [email or web address] Date: 08/04/2020 Prepared by: Norton Blizzard  Exercises Shoulder Flexion Wall Slide with Towel - 1 x daily - 20 reps - 5 second hold Standing Shoulder Internal Rotation Stretch with Towel - 1 x daily - 2 sets - 10 reps - 5 seconds hold Row with band/cable - 3 x weekly - 3 sets - 10 reps - 5 seconds hold Shoulder Extension with Resistance - Palms Forward - 3 x weekly - 3 sets - 10-15 reps Shoulder External Rotation with Resistance - 3 x weekly - 3 sets - 10 reps - 5 seconds hold Sidelying Shoulder External Rotation with Dumbbell - 3 x weekly - 3 sets - 10 reps  Sidelying Shoulder Abduction Palm Forward - 3 x weekly - 3 sets - 10 reps Supine Chest Press with Resistance - 3 x weekly - 3 sets - 10 reps     PT Education - 08/11/20 1440    Education Details Exercise purpose/form. Self management techniques    Person(s) Educated Patient    Methods Explanation;Demonstration;Tactile cues;Verbal cues    Comprehension Verbalized understanding;Verbal cues required;Returned demonstration;Tactile cues required;Need further instruction            PT Short Term Goals - 07/07/20 1415      PT  SHORT TERM GOAL #1   Title Be independent with initial home exercise program for self-management of symptoms.    Baseline Initial HEP provided at IE (07/02/2020);    Time 2    Period Weeks    Status Achieved    Target Date 07/16/20             PT Long Term Goals - 07/02/20 1816      PT LONG TERM GOAL #1   Title Be independent with a long-term home exercise program for self-management of symptoms.    Baseline Initial HEP provided at IE (07/02/2020);    Time 12    Period Weeks    Status New   TARGET DATE FOR ALL LONG TERM GOIALS: 09/24/2020     PT LONG TERM GOAL #2   Title Demonstrate improved FOTO score to equal or greater than 69 by visit #9 to demonstrate improvement in overall condition and self-reported functional ability.    Baseline 62 (07/02/2020);    Time 12    Period Weeks    Status New      PT LONG TERM GOAL #3   Title Have full left shoulder AROM with no compensations or increase in pain in all planes except intermittent end range discomfort to allow patient to complete valued activities with less difficulty.    Baseline  Limited and painful - see abjective exam (07/02/2020);    Time 12    Period Weeks    Status New      PT LONG TERM GOAL #4   Title Improve left shoulder strength to 4+/5 with no increase in pain for improved ability to allow patient to complete valued functional tasks such as reaching overhead and dressing with less difficulty.    Baseline weak and painful - see objective exam (07/02/2020);    Time 12    Period Weeks    Status New      PT LONG TERM GOAL #5   Title Complete community, work and/or recreational activities without limitation due to current condition.    Baseline Functional Limitations: sleeping, lifting things overhead, reaching behind back and out to grab something, using L UE over head, grooming, dressing, swimming in the ocean, exercising for weight loss and fitness (especially working out arms) (07/02/2020);    Time 12    Period  Weeks    Status New                 Plan - 08/11/20 1507    Clinical Impression Statement Patient tolerated treatment well and was able to advance reps/weight in several activities. Continues to have limitation especially in abduction. Patient would benefit from continued management of limiting condition by skilled physical therapist to address remaining impairments and functional limitations to work towards stated goals and return to PLOF or maximal functional independence.    Personal Factors and Comorbidities Age;Comorbidity 2;Profession;Past/Current Experience;Time since onset of injury/illness/exacerbation    Comorbidities Relevant past medical history and comorbidities include hypertension, obesity.    Examination-Activity Limitations Bathing;Bed Mobility;Hygiene/Grooming;Lift;Reach Overhead;Sleep;Dressing    Examination-Participation Restrictions Other   sleeping, lifting things overhead, reaching behind back and out to grab something, using L UE over head, grooming, dressing, swimming in the ocean, exercising for weight loss and fitness (especially working out arms).   Stability/Clinical Decision Making Stable/Uncomplicated    Rehab Potential Excellent    PT Frequency 2x / week    PT Duration 12 weeks    PT Treatment/Interventions ADLs/Self Care Home Management;Aquatic Therapy;Cryotherapy;Moist Heat;Therapeutic activities;Therapeutic exercise;Neuromuscular re-education;Patient/family education;Manual techniques;Dry needling;Passive range of motion;Taping;Joint Manipulations;Spinal Manipulations    PT Next Visit Plan update HEP as appropriate, progressive strengthening and ROM as tolerated    PT Home Exercise Plan Medbridge  Access Code: [email or web address]    Consulted and Agree with Plan of Care Patient           Patient will benefit from skilled therapeutic intervention in order to improve the following deficits and impairments:  Pain, Increased muscle spasms, Decreased activity  tolerance, Decreased endurance, Decreased range of motion, Decreased strength, Hypomobility, Impaired UE functional use, Impaired perceived functional ability, Impaired flexibility  Visit Diagnosis: Left shoulder pain, unspecified chronicity  Muscle weakness (generalized)     Problem List There are no problems to display for this patient.   Luretha Murphy. Ilsa Iha, PT, DPT 08/11/20, 3:19 PM  [facility] PHYSICAL AND SPORTS MEDICINE [address], [place], [address] Phone: [phone]   Fax:  [phone]  Name: Linda Short MRN: [id number] Date of Birth: 07-Jul-1956

## 2020-08-13 ENCOUNTER — Encounter: Payer: 59 | Admitting: Physical Therapy

## 2020-08-19 ENCOUNTER — Ambulatory Visit: Payer: 59 | Admitting: Physical Therapy

## 2020-08-19 ENCOUNTER — Encounter: Payer: Self-pay | Admitting: Physical Therapy

## 2020-08-19 DIAGNOSIS — M25512 Pain in left shoulder: Secondary | ICD-10-CM

## 2020-08-19 DIAGNOSIS — M6281 Muscle weakness (generalized): Secondary | ICD-10-CM

## 2020-08-19 NOTE — Therapy (Signed)
[place] PHYSICAL AND SPORTS MEDICINE [address], [place], [address] Phone: [phone]   Fax:  [phone]  Physical Therapy Treatment / Progress Note Reporting period: 07/02/2020 - 08/19/2020  Patient Details  Name: Linda Short MRN: [id number] Date of Birth: 06-Jan-1956 Referring Provider (PT): Gregor Hams, MD   Encounter Date: 08/19/2020   PT End of Session - 08/19/20 1956    Visit Number 10    Number of Visits 24    Date for PT Re-Evaluation 09/24/20    Authorization Type [employer] reporting period from 07/02/2020    Progress Note Due on Visit 10    PT Start Time 1300    PT Stop Time 1340    PT Time Calculation (min) 40 min    Activity Tolerance Patient tolerated treatment well    Behavior During Therapy [facility] for tasks assessed/performed           History reviewed. No pertinent past medical history.  History reviewed. No pertinent surgical history.  There were no vitals filed for this visit.   Subjective Assessment - 08/19/20 1306    Subjective Patient reports her shoulder continues to get better. She will be out of town next week and would like to be more independent in her management. No pain currently. Felt like she got a good work out following last session. She has done her HEP 2 more times since last session plus some shower exercises. Still feels like she is limited in getting her left arm behind her back. Feel minimal to no restriciton in reaching forward. Side reaching is still a bit more difficult. She has noticed it is easier to put on her seat belt.  No questions about current HEP .    Pertinent History Patient is a 64 y.o. female who presents to outpatient physical therapy with a referral for medical diagnosis acute pain of left shoulder. This patient's chief complaints consist of left shoulder and arm pain and lack of ROM leading to the following functional deficits: difficulty sleeping, lifting  things overhead, reaching behind back and out to grab something, using L UE over head, grooming, dressing, swimming in the ocean, exercising for weight loss and fitness (especially working out arms). Relevant past medical history and comorbidities include hypertension, obesity. Patient denies hx of cancer, stroke, seizures, lung problem, major cardiac events, diabetes, unexplained weight loss, changes in bowel or bladder problems, new onset stumbling or dropping things, prolonged use of steroid medication. Bone density scan a few years ago was fine. She has not had COVID19 that she knows of and has had two [place] vaccinations.    Limitations Lifting;Other (comment)   leeping, lifting things overhead, reaching behind back and out to grab something, using L UE over head, grooming, dressing, swimming in the ocean, exercising for weight loss and fitness (especially working out arms).   Diagnostic tests 06/20/2020 Diagnostic Limited MSK Ultrasound of: Left shoulderBiceps tendon intact normal-appearingSubscapularis tendon intact normal.Supraspinatus tendon intact moderate increased subacromial bursa thickness present.Infraspinatus tendon normal-appearingAC joint degenerative narrowed with effusion.Impression: Subacromial bursitis and AC DJD    Patient Stated Goals reduce the pain and get full range of motion and function    Currently in Pain? No/denies          OBJECTIVE FOTO = 79 (08/19/2020)   PERIPHERAL JOINT MOTION (in degrees) Active Range of Motion (AROM) *Indicates pain 07/02/20 08/19/20  Joint/Motion R/L R/L  Shoulder    Flexion 155/101* /131  Extension  50/36* /40  Abduction 155/60* /105*  External rotation 64/56 /75  Internal rotation T6/glute* /L3  Comments:  07/02/20: ER measured at neutral position. B elbows and wrists WFL.  08/19/20: ER at 90 abduction. ER at neutral 60 degrees.    MUSCLE PERFORMANCE (MMT):  *Indicates pain 07/02/20 08/19/20  Joint/Motion R/L R/L    Shoulder    Flexion 4/4 5/4+  Abduction 5/3+* 5/4+*  External rotation 4+/4* 4+/4+  Internal rotation 4+/4+ 4+/4+  Elbow    Flexion 5/4+ 5/5  Extension 4+/4+ 4+/4+  Hand    Grip B WFL /  Comments:  07/02/20: within available range 08/19/20: within available range, mild pain with abduction.    TREATMENT: denies sensitivity to latex  Therapeutic exercise:to centralize symptoms and improve ROM, strength, muscular endurance, and activity tolerance required for successful completion of functional activities. -Upper body ergometerwith no added resistanceencourage joint nutrition, warm tissue, induce analgesic effect of aerobic exercise, improve muscular strength and endurance, and prepare for remainder of session.Level 5, switching direction every 1 minute for 5 minutes. - measurements to assess progress (see above).  - demonstration and trial of how to use LAX or tennis ball on wall for self soft tissue mobilization.  - review of HEP and recommendations for possible discharge and when to return to care if needed.   HOME EXERCISE PROGRAM Access Code: [email or web address] URL: [email or web address] Date: 08/04/2020 Prepared by: Rosita Kea  Exercises Shoulder Flexion Wall Slide with Towel - 1 x daily - 20 reps - 5 second hold Standing Shoulder Internal Rotation Stretch with Towel - 1 x daily - 2 sets - 10 reps - 5 seconds hold Row with band/cable - 3 x weekly - 3 sets - 10 reps - 5 seconds hold Shoulder Extension with Resistance - Palms Forward - 3 x weekly - 3 sets - 10-15 reps Shoulder External Rotation with Resistance - 3 x weekly - 3 sets - 10 reps - 5 seconds hold Sidelying Shoulder External Rotation with Dumbbell - 3 x weekly - 3 sets - 10 reps  Sidelying Shoulder Abduction Palm Forward - 3 x weekly - 3 sets - 10 reps Supine Chest Press with Resistance - 3 x weekly - 3 sets - 10 reps   PT Education - 08/19/20 1955    Education Details Exercise  purpose/form. Self management techniques. progress, POC, potential discharge instructions    Person(s) Educated Patient    Methods Explanation;Demonstration;Verbal cues;Handout    Comprehension Verbalized understanding;Returned demonstration            PT Short Term Goals - 07/07/20 1415      PT SHORT TERM GOAL #1   Title Be independent with initial home exercise program for self-management of symptoms.    Baseline Initial HEP provided at IE (07/02/2020);    Time 2    Period Weeks    Status Achieved    Target Date 07/16/20             PT Long Term Goals - 08/19/20 1951      PT LONG TERM GOAL #1   Title Be independent with a long-term home exercise program for self-management of symptoms.    Baseline Initial HEP provided at IE (07/02/2020); currently participating in appropriate HEP (08/19/2020);    Time 12    Period Weeks    Status Achieved   TARGET DATE FOR ALL LONG TERM GOIALS: 09/24/2020     PT LONG TERM GOAL #2   Title Demonstrate improved FOTO score to equal  or greater than 69 by visit #9 to demonstrate improvement in overall condition and self-reported functional ability.    Baseline 62 (07/02/2020); 79 at visit #11 (08/19/2020);    Time 12    Period Weeks    Status Achieved      PT LONG TERM GOAL #3   Title Have full left shoulder AROM with no compensations or increase in pain in all planes except intermittent end range discomfort to allow patient to complete valued activities with less difficulty.    Baseline Limited and painful - see abjective exam (07/02/2020); significant improvements but continues to have limitations - see objective exam (08/19/2020);    Time 12    Period Weeks    Status New      PT LONG TERM GOAL #4   Title Improve left shoulder strength to 4+/5 with no increase in pain for improved ability to allow patient to complete valued functional tasks such as reaching overhead and dressing with less difficulty.    Baseline weak and painful - see  objective exam (07/02/2020); shoulder strength is 4+/5 in left shoulder but does have mild pain in some motions (08/19/2020);    Time 12    Period Weeks    Status Partially Met      PT LONG TERM GOAL #5   Title Complete community, work and/or recreational activities without limitation due to current condition.    Baseline Functional Limitations: sleeping, lifting things overhead, reaching behind back and out to grab something, using L UE over head, grooming, dressing, swimming in the ocean, exercising for weight loss and fitness (especially working out arms) (07/02/2020); reports feeling much less restricted and can do most of her usual activities. has not started using arms in weight loss fitness programs(08/19/2020);    Time 12    Period Weeks    Status Partially Met                 Plan - 08/19/20 1959    Clinical Impression Statement Patient has completed 10 physical therapy sessions and made good progress towards goals overall.  She has met her Foto goal and has expressed the desire to become more independent with self-management.  She has been provided with a home exercise program with the potential for long-term improvement and instructed on how to advance it over time.  Patient has improved her range of motion as well has her strength with less pain and is back to doing most of her usual activities without difficulty.  She does still lack range of motion especially in abduction but has been continuing to improve weekly with her home exercise program and clinic visits.  She plans to return for further visits only if she fails to continue improving or her conditions worsens.  Patient appears to be ready for a trial of self-management. Patient would benefit from continued management of limiting condition by skilled physical therapist if her condition fails to improve or worsens  to address remaining impairments and functional limitations to work towards stated goals and return to PLOF or  maximal functional independence.    Personal Factors and Comorbidities Age;Comorbidity 2;Profession;Past/Current Experience;Time since onset of injury/illness/exacerbation    Comorbidities Relevant past medical history and comorbidities include hypertension, obesity.    Examination-Activity Limitations Bathing;Bed Mobility;Hygiene/Grooming;Lift;Reach Overhead;Sleep;Dressing    Examination-Participation Restrictions Other   sleeping, lifting things overhead, reaching behind back and out to grab something, using L UE over head, grooming, dressing, swimming in the ocean, exercising for weight loss and fitness (  especially working out arms).   Stability/Clinical Decision Making Stable/Uncomplicated    Rehab Potential Excellent    PT Frequency 2x / week    PT Duration 12 weeks    PT Treatment/Interventions ADLs/Self Care Home Management;Aquatic Therapy;Cryotherapy;Moist Heat;Therapeutic activities;Therapeutic exercise;Neuromuscular re-education;Patient/family education;Manual techniques;Dry needling;Passive range of motion;Taping;Joint Manipulations;Spinal Manipulations    PT Next Visit Plan trial of independent management with HEP. Patient to return only if she fails to continue improving or if she worsens.    PT Home Exercise Plan Medbridge  Access Code: [email or web address]    Consulted and Agree with Plan of Care Patient           Patient will benefit from skilled therapeutic intervention in order to improve the following deficits and impairments:  Pain, Increased muscle spasms, Decreased activity tolerance, Decreased endurance, Decreased range of motion, Decreased strength, Hypomobility, Impaired UE functional use, Impaired perceived functional ability, Impaired flexibility  Visit Diagnosis: Left shoulder pain, unspecified chronicity  Muscle weakness (generalized)     Problem List There are no problems to display for this patient.   Everlean Alstrom. Graylon Good, PT, DPT 08/19/20, 8:00 PM  [place] PHYSICAL AND SPORTS MEDICINE [address] [place], [address] Phone: [phone]   Fax:  [phone]  Name: Linda Short MRN: [id number] Date of Birth: 1956-06-03

## 2020-08-21 ENCOUNTER — Encounter: Payer: 59 | Admitting: Physical Therapy

## 2020-08-25 ENCOUNTER — Encounter: Payer: 59 | Admitting: Physical Therapy

## 2020-08-28 ENCOUNTER — Encounter: Payer: 59 | Admitting: Physical Therapy

## 2020-11-03 ENCOUNTER — Other Ambulatory Visit: Payer: Self-pay | Admitting: Internal Medicine

## 2020-11-03 DIAGNOSIS — Z8249 Family history of ischemic heart disease and other diseases of the circulatory system: Secondary | ICD-10-CM

## 2020-11-24 ENCOUNTER — Ambulatory Visit
Admission: RE | Admit: 2020-11-24 | Discharge: 2020-11-24 | Disposition: A | Discharge status: Home / Self Care | Payer: No Typology Code available for payment source | Source: Ambulatory Visit | Attending: Internal Medicine | Admitting: Internal Medicine

## 2020-11-24 DIAGNOSIS — Z8249 Family history of ischemic heart disease and other diseases of the circulatory system: Secondary | ICD-10-CM

## 2020-11-25 ENCOUNTER — Other Ambulatory Visit: Payer: 59

## 2021-04-17 NOTE — Progress Notes (Signed)
[facility] New Patient Evaluation and Consultation  Referring Provider: Lavina Hamman, MD PCP: Merri Brunette, MD Date of Service: 04/21/2021  SUBJECTIVE Chief Complaint: New Patient (Initial Visit) (Linda Short is a 65 y.o. female complains of incontinence.)  History of Present Illness: Linda Short is a 65 y.o. White or Caucasian female seen in consultation at the request of Dr. Jackelyn Knife for evaluation of stress incontinence.    Review of records from Dr Jackelyn Knife significant for: Had TAH with L ovarian cystectomy in 2004 for fibroids.  Has been wearing a pad for her leakage. Previously was manageable but has been worsening.   Urinary Symptoms: Leaks urine with going from sitting to standing, with movement to the bathroom, with urgency, and without sensation Leaks "too many" time(s) per day. Has been happening more often.  Pad use: 1-6 pads per day.   She is bothered by her UI symptoms.   Day time voids 10+.  Nocturia: 1-2 times per night to void. Voiding dysfunction: she empties her bladder well.  does not use a catheter to empty bladder.  When urinating, she feels she has no difficulties Drinks: 18-24oz coffee, 32- 80oz water (sometimes with Mio flavor drops) per day  UTIs:  0  UTI's in the last year.   Denies history of blood in urine and kidney or bladder stones  Pelvic Organ Prolapse Symptoms:                  She Denies a feeling of a bulge the vaginal area.   Bowel Symptom: Bowel movements: 1 time(s) per day Stool consistency: soft  Straining: no.  Splinting: no.  Incomplete evacuation: no.  She Denies accidental bowel leakage / fecal incontinence Bowel regimen:  occasionally uses magnesium citrate   Sexual Function Sexually active: yes.  Sexual orientation: Straight Pain with sex: No  Pelvic Pain Denies pelvic pain    Past Medical History: History reviewed. No pertinent past medical history.   Past Surgical History:    Past Surgical History:  Procedure Laterality Date   ABDOMINAL HYSTERECTOMY     Still has ovaries     Past OB/GYN History: OB History  Gravida Para Term Preterm AB Living  1         1  SAB IAB Ectopic Multiple Live Births          1    # Outcome Date GA Lbr Len/2nd Weight Sex Delivery Anes PTL Lv  1 Gravida            S/p hysterectomy in 2004 for fibroids   Medications: She has a current medication list which includes the following prescription(s): trospium chloride and vivelle-dot.   Allergies: Patient has No Known Allergies.   Social History:  Social History   Tobacco Use   Smoking status: Never   Smokeless tobacco: Never  Vaping Use   Vaping Use: Never used  Substance Use Topics   Alcohol use: Not Currently   Drug use: Never    Relationship status: married She lives with husband.   She is employedEcologist. Regular exercise: No History of abuse: No  Family History:   Family History  Problem Relation Age of Onset   Hypertension Mother    Hyperlipidemia Father      Review of Systems: Review of Systems  Constitutional:  Positive for weight loss. Negative for fever and malaise/fatigue.  Respiratory:  Negative for cough, shortness of breath and wheezing.   Cardiovascular:  Negative for chest pain,  palpitations and leg swelling.  Gastrointestinal:  Negative for abdominal pain and blood in stool.  Genitourinary:  Negative for dysuria.  Musculoskeletal:  Negative for myalgias.  Skin:  Negative for rash.  Neurological:  Negative for dizziness and headaches.  Endo/Heme/Allergies:  Does not bruise/bleed easily.  Psychiatric/Behavioral:  Negative for depression. The patient is not nervous/anxious.     OBJECTIVE Physical Exam: Vitals:   04/21/21 0946  BP: 120/78  Pulse: 64  Weight: 162 lb (73.5 kg)  Height: 5\' 4"  (1.626 m)    Physical Exam Constitutional:      General: She is not in acute distress. Pulmonary:     Effort: Pulmonary effort is  normal.  Abdominal:     General: There is no distension.     Palpations: Abdomen is soft.     Tenderness: There is no abdominal tenderness. There is no rebound.  Musculoskeletal:        General: No swelling. Normal range of motion.  Skin:    General: Skin is warm and dry.     Findings: No rash.  Neurological:     Mental Status: She is alert and oriented to person, place, and time.  Psychiatric:        Mood and Affect: Mood normal.        Behavior: Behavior normal.     GU / Detailed Urogynecologic Evaluation:  Pelvic Exam: Normal external female genitalia; Bartholin's and Skene's glands normal in appearance; urethral meatus normal in appearance, no urethral masses or discharge.   CST: negative  s/p hysterectomy: Speculum exam reveals normal vaginal mucosa without  atrophy and normal vaginal cuff.  Adnexa no mass, fullness, tenderness.     Pelvic floor strength II/V  Pelvic floor musculature: Right levator non-tender, Right obturator non-tender, Left levator non-tender, Left obturator non-tender  POP-Q:   POP-Q  -3                                            Aa   -3                                           Ba  -10                                              C   3                                            Gh  4                                            Pb  10.5                                            tvl   -2  Ap  -2                                            Bp                                                 D     Rectal Exam:  Normal external rectum  Post-Void Residual (PVR) by Bladder Scan: In order to evaluate bladder emptying, we discussed obtaining a postvoid residual and she agreed to this procedure.  Procedure: The ultrasound unit was placed on the patient's abdomen in the suprapubic region after the patient had voided. A PVR of 19 ml was obtained by bladder scan.  Laboratory Results: POC urine:  trace blood   ASSESSMENT AND PLAN Linda Short is a 65 y.o. with:  1. Overactive bladder   2. Urinary frequency     OAB We discussed the symptoms of overactive bladder (OAB), which include urinary urgency, urinary frequency, nocturia, with or without urge incontinence.  While we do not know the exact etiology of OAB, several treatment options exist. We discussed management including behavioral therapy (decreasing bladder irritants, urge suppression strategies, timed voids, bladder retraining), physical therapy, medication; for refractory cases posterior tibial nerve stimulation, sacral neuromodulation, and intravesical botulinum toxin injection.  For anticholinergic medications, we discussed the potential side effects of anticholinergics including dry eyes, dry mouth, constipation, cognitive impairment and urinary retention. For Beta-3 agonist medication, we discussed the potential side effect of elevated blood pressure which is more likely to occur in individuals with uncontrolled hypertension. - She will work on reducing irritants and bladder retraining at home - Will also start Trospium 60mg  daily  Return 6 weeks follow up  , MD   Medical Decision Making:  - Reviewed/ ordered a clinical laboratory test - Review and summation of prior records

## 2021-04-20 ENCOUNTER — Telehealth: Payer: Self-pay

## 2021-04-20 NOTE — Telephone Encounter (Signed)
Linda Short is a 65 y.o. female was called and contacted re: New pt Pre appt call to collect history information. Pt verified using 2 identifiers. Confirmation that I am speaking with the correct person.  Chart was updated: -Allergy -Medication -Confirm pharmacy -OB history  Pt was notified to arrive 15 min early and we will need a urine sample when she arrives. Pt verbalized understanding.

## 2021-04-21 ENCOUNTER — Other Ambulatory Visit: Payer: Self-pay

## 2021-04-21 ENCOUNTER — Encounter: Payer: Self-pay | Admitting: Obstetrics and Gynecology

## 2021-04-21 ENCOUNTER — Ambulatory Visit (INDEPENDENT_AMBULATORY_CARE_PROVIDER_SITE_OTHER): Payer: Managed Care, Other (non HMO) | Admitting: Obstetrics and Gynecology

## 2021-04-21 VITALS — BP 120/78 | HR 64 | Ht 64.0 in | Wt 162.0 lb

## 2021-04-21 DIAGNOSIS — R350 Frequency of micturition: Secondary | ICD-10-CM | POA: Diagnosis not present

## 2021-04-21 DIAGNOSIS — N3281 Overactive bladder: Secondary | ICD-10-CM | POA: Diagnosis not present

## 2021-04-21 LAB — POCT URINALYSIS DIPSTICK
Appearance: NORMAL
Bilirubin, UA: NEGATIVE
Blood, UA: NEGATIVE
Glucose, UA: NEGATIVE
Ketones, UA: NEGATIVE
Leukocytes, UA: NEGATIVE
Nitrite, UA: NEGATIVE
Protein, UA: NEGATIVE
Spec Grav, UA: 1.025 (ref 1.010–1.025)
Urobilinogen, UA: 0.2 E.U./dL
pH, UA: 6 (ref 5.0–8.0)

## 2021-04-21 MED ORDER — TROSPIUM CHLORIDE ER 60 MG PO CP24: 1.0000 | ORAL_CAPSULE | Freq: Every day | ORAL | 5 refills | Status: DC

## 2021-04-21 NOTE — Patient Instructions (Signed)

## 2021-06-05 ENCOUNTER — Other Ambulatory Visit: Payer: Self-pay

## 2021-06-05 ENCOUNTER — Ambulatory Visit: Payer: Managed Care, Other (non HMO) | Admitting: Obstetrics and Gynecology

## 2021-06-05 ENCOUNTER — Encounter: Payer: Self-pay | Admitting: Obstetrics and Gynecology

## 2021-06-05 DIAGNOSIS — N3281 Overactive bladder: Secondary | ICD-10-CM

## 2021-06-05 MED ORDER — TROSPIUM CHLORIDE ER 60 MG PO CP24: 1.0000 | ORAL_CAPSULE | Freq: Every day | ORAL | 3 refills | Status: DC

## 2021-06-05 NOTE — Progress Notes (Signed)
[facility] Return Visit  SUBJECTIVE  History of Present Illness: Linda Short is a 65 y.o. female seen in follow-up for OAB. Plan at last visit was Trospium 60mg  daily and reduce bladder irritants (coffee).   Has decreased pad amount and leaks a lot less. Able to get to the bathroom easier. Has been working on bladder retraining. Has not really decreased coffee intake, but is slowly cutting back. Has noticed more urgency with caffeine intake.   Past Medical History: Patient  has no past medical history on file.   Past Surgical History: She  has a past surgical history that includes Abdominal hysterectomy.   Medications: She has a current medication list which includes the following prescription(s): trospium chloride and vivelle-dot.   Allergies: Patient has No Known Allergies.   Social History: Patient  reports that she has never smoked. She has never used smokeless tobacco. She reports that she does not currently use alcohol. She reports that she does not use drugs.      OBJECTIVE     Physical Exam: Vitals:   06/05/21 0938  BP: 112/76  Pulse: 72  Weight: 158 lb (71.7 kg)  Height: 5\' 4"  (1.626 m)   Gen: No apparent distress, A&O x 3.  Detailed Urogynecologic Evaluation:  Deferred.    ASSESSMENT AND PLAN    [name] is a 65 y.o. with:  1. Overactive bladder    - Continue Trospium 60mg  ER daily. Refills provided - Will continue to work on bladder retraining at home and decreasing caffeine intake.   Follow up 6 months or sooner if needed.   Grafton Folk, MD  Time spent: I spent 15 minutes dedicated to the care of this patient on the date of this encounter to include pre-visit review of records, face-to-face time with the patient and post visit documentation and ordering medication/ testing.

## 2021-11-27 ENCOUNTER — Ambulatory Visit: Payer: Managed Care, Other (non HMO) | Admitting: Obstetrics and Gynecology

## 2022-01-22 ENCOUNTER — Ambulatory Visit: Payer: Managed Care, Other (non HMO) | Admitting: Obstetrics and Gynecology

## 2022-02-11 ENCOUNTER — Ambulatory Visit: Payer: Managed Care, Other (non HMO) | Admitting: Obstetrics and Gynecology

## 2022-02-11 ENCOUNTER — Encounter: Payer: Self-pay | Admitting: Obstetrics and Gynecology

## 2022-02-11 DIAGNOSIS — N3281 Overactive bladder: Secondary | ICD-10-CM | POA: Diagnosis not present

## 2022-02-11 MED ORDER — TROSPIUM CHLORIDE ER 60 MG PO CP24: 1.0000 | ORAL_CAPSULE | Freq: Every day | ORAL | 3 refills | Status: DC

## 2022-02-11 NOTE — Progress Notes (Signed)
[facility] Return Visit  SUBJECTIVE  History of Present Illness: Linda Short is a 66 y.o. female seen in follow-up for OAB. Plan at last visit was Trospium 60mg  daily.  She is able to go several hours between. Does not usually have accidents but still wears a pad just in case.   On days when she has more caffeine, she has noticed more leakage on the way to the bathroom. Has decreased her caffeine intake overall.   Past Medical History: Patient  has no past medical history on file.   Past Surgical History: She  has a past surgical history that includes Abdominal hysterectomy.   Medications: She has a current medication list which includes the following prescription(s): trospium chloride and vivelle-dot.   Allergies: Patient has No Known Allergies.   Social History: Patient  reports that she has never smoked. She has never used smokeless tobacco. She reports that she does not currently use alcohol. She reports that she does not use drugs.      OBJECTIVE     Physical Exam: Vitals:   02/11/22 1326  BP: 129/82  Pulse: 98    Gen: No apparent distress, A&O x 3.  Detailed Urogynecologic Evaluation:  Deferred.    ASSESSMENT AND PLAN    Linda Short is a 66 y.o. with:  1. Overactive bladder     - Continue Trospium 60mg  ER daily. Refills sent today  Follow up 1 year or sooner if needed  66, MD  Time spent: I spent 20 minutes dedicated to the care of this patient on the date of this encounter to include pre-visit review of records, face-to-face time with the patient and post visit documentation and ordering medication/ testing.

## 2022-07-12 ENCOUNTER — Encounter: Payer: Self-pay | Admitting: *Deleted

## 2022-07-16 ENCOUNTER — Other Ambulatory Visit: Payer: Self-pay | Admitting: Obstetrics and Gynecology

## 2022-07-16 DIAGNOSIS — N3281 Overactive bladder: Secondary | ICD-10-CM

## 2022-11-01 ENCOUNTER — Encounter: Payer: Self-pay | Admitting: Cardiology

## 2022-11-01 ENCOUNTER — Ambulatory Visit: Payer: Managed Care, Other (non HMO) | Attending: Cardiology | Admitting: Cardiology

## 2022-11-01 VITALS — BP 118/72 | HR 65 | Ht 64.0 in | Wt 164.0 lb

## 2022-11-01 DIAGNOSIS — R079 Chest pain, unspecified: Secondary | ICD-10-CM | POA: Diagnosis not present

## 2022-11-01 DIAGNOSIS — R002 Palpitations: Secondary | ICD-10-CM

## 2022-11-01 NOTE — Patient Instructions (Addendum)
Medication Instructions:   No changes  *If you need a refill on your cardiac medications before your next appointment, please call your pharmacy*   Lab Work: Not needed    Testing/Procedures:  Not needed  Follow-Up: At [facility], you and your health needs are our priority.  As part of our continuing mission to provide you with exceptional heart care, we have created designated Provider Care Teams.  These Care Teams include your primary Cardiologist (physician) and Advanced Practice Providers (APPs -  Physician Assistants and Nurse Practitioners) who all work together to provide you with the care you need, when you need it.     Your next appointment:   7 month(s)  The format for your next appointment:   In Person  Provider:   Glenetta Hew, MD    Other Instructions   Will obtain your result from your monitor will call you if any further changes are needed,  Hydrate , hydrate  For fast heart rate  do the following Recommendations for vagal maneuvers: "Bearing down" Coughing Gagging Icy, cold towel on face or drink ice cold water

## 2022-11-01 NOTE — Progress Notes (Unsigned)
Primary Care Provider: Deland Pretty, [place] Cardiologist: None Electrophysiologist: None  Clinic Note: No chief complaint on file.  ===================================  ASSESSMENT/PLAN   Problem List Items Addressed This Visit   None  ===================================  HPI:    Linda Short is a 67 y.o. female who is being seen today for the evaluation of Intermittent Right Chest Pain with Heart Rate 140 BPM at the request of Deland Pretty, MD.  Linda Short was seen by Dr. Shelia Media on September 29, 2022 on ***  Recent Hospitalizations: ***  Reviewed  CV studies:    The following studies were reviewed today: (if available, images/films reviewed: From Epic Chart or Care Everywhere) ***:   Interval History:   [place]   CV Review of Symptoms (Summary) Cardiovascular ROS: {roscv:310661}  REVIEWED OF SYSTEMS   ROS  I have reviewed and (if needed) personally updated the patient's problem list, medications, allergies, past medical and surgical history, social and family history.   PAST MEDICAL HISTORY   No past medical history on file.  PAST SURGICAL HISTORY   Past Surgical History:  Procedure Laterality Date   ABDOMINAL HYSTERECTOMY     Still has ovaries      There is no immunization history on file for this patient.  MEDICATIONS/ALLERGIES   No outpatient medications have been marked as taking for the 11/01/22 encounter (Appointment) with Leonie Man, MD.    No Known Allergies  SOCIAL HISTORY/FAMILY HISTORY   Reviewed in Epic:   Social History   Tobacco Use   Smoking status: Never   Smokeless tobacco: Never  Vaping Use   Vaping Use: Never used  Substance Use Topics   Alcohol use: Not Currently   Drug use: Never   Social History   Social History Narrative   Not on file   Family History  Problem Relation Age of Onset   Hypertension Mother    Hyperlipidemia Father     OBJCTIVE -PE,  EKG, labs   Wt Readings from Last 3 Encounters:  06/05/21 158 lb (71.7 kg)  04/21/21 162 lb (73.5 kg)  06/20/20 192 lb (87.1 kg)    Physical Exam: There were no vitals taken for this visit. Physical Exam   Adult ECG Report  Rate: *** ;  Rhythm: {rhythm:17366};   Narrative Interpretation: ***  Recent Labs:  ***      No results found for: "CHOL", "HDL", "LDLCALC", "LDLDIRECT", "TRIG", "CHOLHDL" No results found for: "CREATININE", "BUN", "NA", "K", "CL", "CO2"     No data to display          No results found for: "HGBA1C" No results found for: "TSH"  ================================================== I spent a total of *** minutes with the patient spent in direct patient consultation.  Additional time spent with chart review  / charting (studies, outside notes, etc): *** min Total Time: *** min  Current medicines are reviewed at length with the patient today.  (+/- concerns) ***  Notice: This dictation was prepared with Dragon dictation along with smart phrase technology. Any transcriptional errors that result from this process are unintentional and may not be corrected upon review.   Studies Ordered:  No orders of the defined types were placed in this encounter.  No orders of the defined types were placed in this encounter.   Patient Instructions / Medication Changes & Studies & Tests Ordered   There are no Patient Instructions on file for this visit.    Leonie Green.  Ellyn Hack, MD, MS Glenetta Hew, M.D., M.S. Interventional Cardiologist  [place]  Pager # [phone] Phone # [phone] [address] [place], [place] [address]   Thank you for choosing [facility]!!

## 2022-11-04 ENCOUNTER — Encounter: Payer: Self-pay | Admitting: Cardiology

## 2022-11-04 DIAGNOSIS — R079 Chest pain, unspecified: Secondary | ICD-10-CM | POA: Insufficient documentation

## 2022-11-04 DIAGNOSIS — R002 Palpitations: Secondary | ICD-10-CM | POA: Insufficient documentation

## 2022-11-04 NOTE — Assessment & Plan Note (Signed)
At least 2 episodes of atypical sounding chest discomfort not necessarily exertional and not prolonged.  She is not having any further episodes at this point.  I suspect that based on the location and description, it was probably musculoskeletal or could have been related to anxiety.  No longer present at this point.  She had a Coronary Calcium Score done less than 2 years ago that was relatively reassuring with a score of 0.  Unlikely that she would have developed obstructive CAD at this point.  Since she is relatively asymptomatic at this point, we will reassess her in 6 6 to 7 months to see how symptoms are going.  She does exercise routinely and therefore we will reassess to see if she has exertional symptoms.

## 2022-11-04 NOTE — Assessment & Plan Note (Signed)
Unfortunate, at the time of her visit I did not have the monitoring for me.  There was mention of some fast heart rate spells.  On review of the summary, she had 3 short atrial runs somewhere between 5 to 7 beats that went fast.  It is possible that she has had some short SVT runs but atrial runs by definition do not necessarily correlate with having SVT.  I think a lot of this is brought on by a lot of the stress that was going on being the second of her mother's will and all the family issues.  She seems to be stabilized now.  We did talk about vagal maneuvers and maintain adequate hydration, avoiding triggers.  Since symptoms have not recurred in several weeks now.  I think we can hold off on further evaluation.  Her Coronary Calcium Score is very reassuring as was her monitor.  We will reassess in 6 to 7 months

## 2023-01-20 ENCOUNTER — Telehealth: Payer: Self-pay

## 2023-01-20 ENCOUNTER — Telehealth: Payer: Self-pay | Admitting: *Deleted

## 2023-01-20 NOTE — Telephone Encounter (Signed)
Pt has been scheduled for tele pre op appt 04/11/23 @ 2 pm. Med rec and consent are done.     Patient Consent for Virtual Visit        Linda Short [facility] has provided verbal consent on 01/20/2023 for a virtual visit (video or telephone).   CONSENT FOR VIRTUAL VISIT FOR:  Linda Short  By participating in this virtual visit I agree to the following:  I hereby voluntarily request, consent and authorize [facility] and its employed or contracted physicians, physician assistants, nurse practitioners or other licensed health care professionals (the Practitioner), to provide me with telemedicine health care services (the "Services") as deemed necessary by the treating Practitioner. I acknowledge and consent to receive the Services by the Practitioner via telemedicine. I understand that the telemedicine visit will involve communicating with the Practitioner through live audiovisual communication technology and the disclosure of certain medical information by electronic transmission. I acknowledge that I have been given the opportunity to request an in-person assessment or other available alternative prior to the telemedicine visit and am voluntarily participating in the telemedicine visit.  I understand that I have the right to withhold or withdraw my consent to the use of telemedicine in the course of my care at any time, without affecting my right to future care or treatment, and that the Practitioner or I may terminate the telemedicine visit at any time. I understand that I have the right to inspect all information obtained and/or recorded in the course of the telemedicine visit and may receive copies of available information for a reasonable fee.  I understand that some of the potential risks of receiving the Services via telemedicine include:  Delay or interruption in medical evaluation due to technological equipment failure or disruption; Information transmitted may not be sufficient  (e.g. poor resolution of images) to allow for appropriate medical decision making by the Practitioner; and/or  In rare instances, security protocols could fail, causing a breach of personal health information.  Furthermore, I acknowledge that it is my responsibility to provide information about my medical history, conditions and care that is complete and accurate to the best of my ability. I acknowledge that Practitioner's advice, recommendations, and/or decision may be based on factors not within their control, such as incomplete or inaccurate data provided by me or distortions of diagnostic images or specimens that may result from electronic transmissions. I understand that the practice of medicine is not an exact science and that Practitioner makes no warranties or guarantees regarding treatment outcomes. I acknowledge that a copy of this consent can be made available to me via my patient portal [facility] MyChart), or I can request a printed copy by calling the office of [facility].    I understand that my insurance will be billed for this visit.   I have read or had this consent read to me. I understand the contents of this consent, which adequately explains the benefits and risks of the Services being provided via telemedicine.  I have been provided ample opportunity to ask questions regarding this consent and the Services and have had my questions answered to my satisfaction. I give my informed consent for the services to be provided through the use of telemedicine in my medical care

## 2023-01-20 NOTE — Telephone Encounter (Signed)
   Name: Linda Short [facility]  DOB: Oct 01, 1955  MRN: [id number]  Primary Cardiologist: Bryan Lemma, MD   Preoperative team, please contact this patient and set up a phone call appointment for further preoperative risk assessment. Please obtain consent and complete medication review. Thank you for your help.  I confirm that guidance regarding antiplatelet and oral anticoagulation therapy has been completed and, if necessary, noted below.  None requested.   Ronney Asters, NP 01/20/2023, 2:04 PM [facility]

## 2023-01-20 NOTE — Telephone Encounter (Signed)
Pt has been scheduled for tele pre op appt 04/11/23 @ 2 pm. Med rec and consent are done.

## 2023-01-20 NOTE — Telephone Encounter (Signed)
   Pre-operative Risk Assessment    Patient Name: Linda Short [facility]  DOB: Mar 16, 1956 MRN: [id number]      Request for Surgical Clearance    Procedure:   Colonoscopy  Date of Surgery:  Clearance TBD                                 Surgeon:  Dr. Phillips Climes Group or Practice Name:  [facility], [place] Phone number:  [phone] Fax number:  [phone]   Type of Clearance Requested:   - Medical    Type of Anesthesia:   Propofol   Additional requests/questions:    Signed, Zada Finders   01/20/2023, 1:56 PM

## 2023-02-21 IMAGING — CT CT CARDIAC CORONARY ARTERY CALCIUM SCORE
3 series · 14 of 20 positions shown, 16 images · non-contrast
Comparison: None.

CLINICAL DATA: Family history of heart disease.

EXAM:
CT CARDIAC CORONARY ARTERY CALCIUM SCORE
TECHNIQUE: Non-contrast imaging through the heart was performed using
prospective ECG gating. Image post processing was performed on an
independent workstation, allowing for quantitative analysis of the
heart and coronary arteries. Note that this exam targets the heart
and the chest was not imaged in its entirety.

[Series 2: calcium scoring 2.00 qr36 bestdiast 70% hrt calciu · axial · 0.36mm/px · z∈[+1677,+1759]mm · 4 of 69 slices shown]
[im 14/69  vessel]
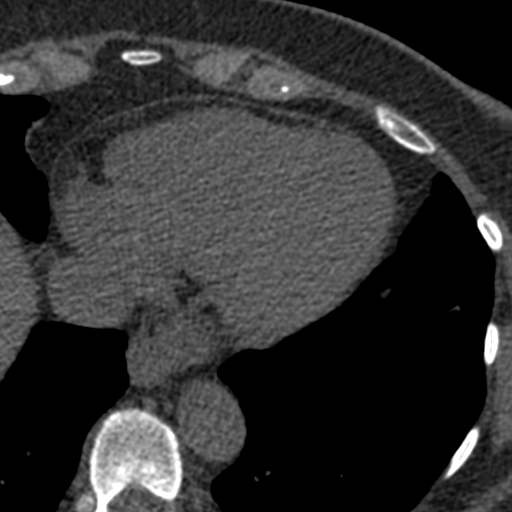
[im 28/69  vessel]
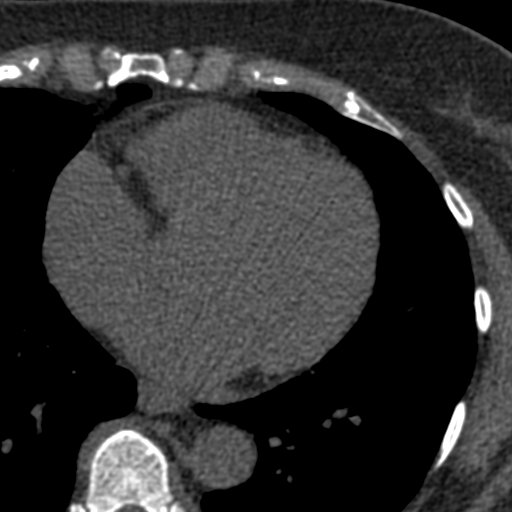
[im 41/69  vessel]
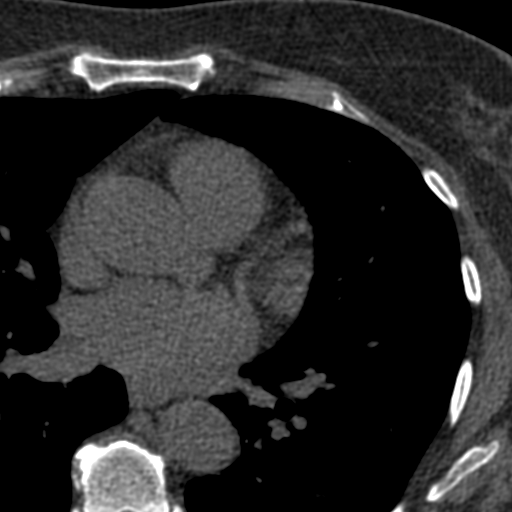
[im 55/69  vessel]
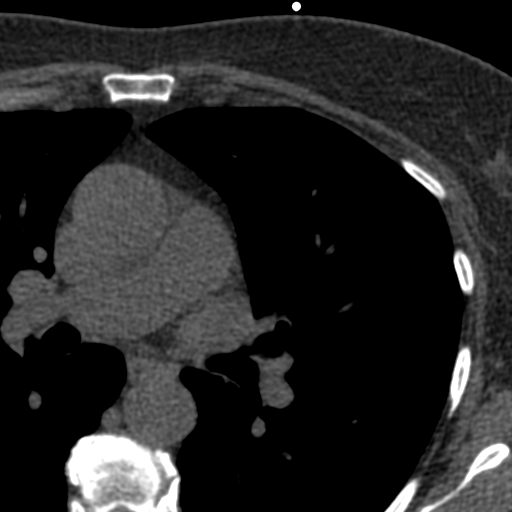

[Series 3: calcium scoring 2.00 br40 bestdiast 70% axial · axial · 0.58mm/px · z∈[+1672,+1764]mm · 5 of 70 slices shown, 7 images]
[im 12/70  vessel]
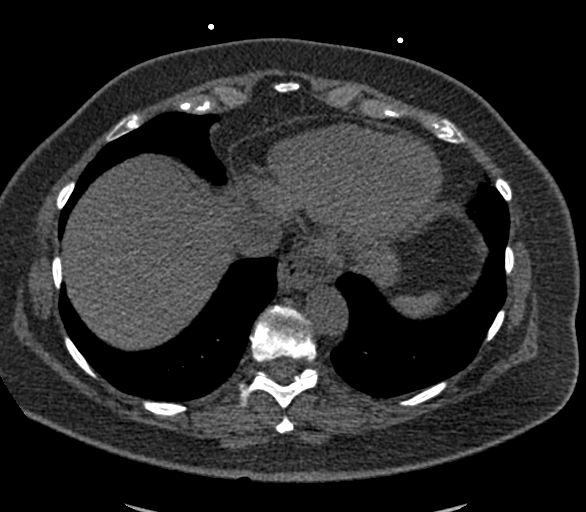
[im 12/70  lung]
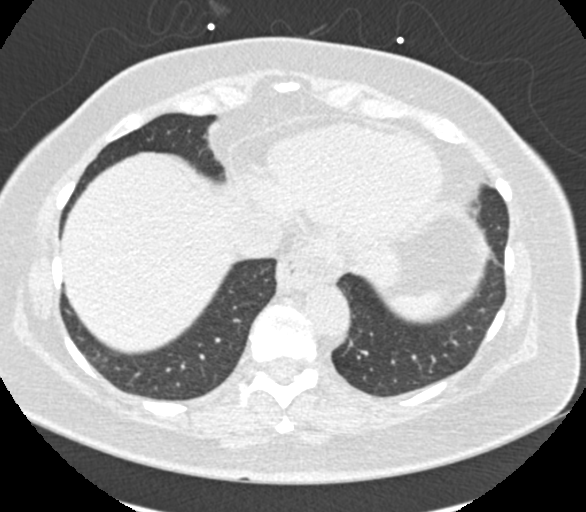
[im 24/70  vessel]
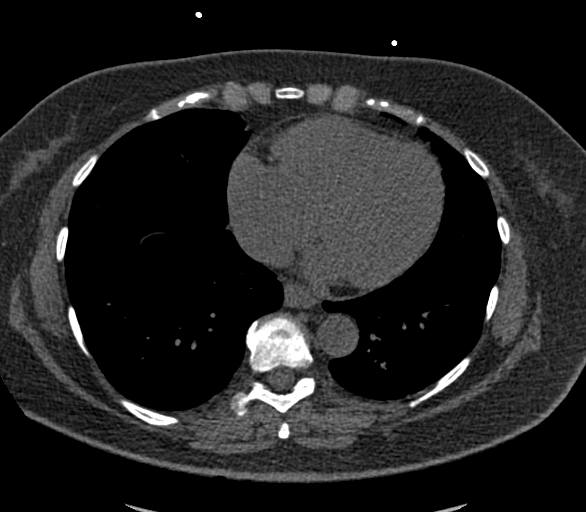
[im 35/70  vessel]
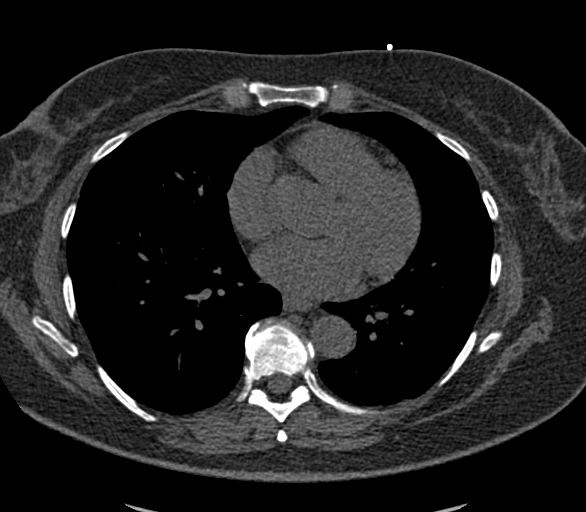
[im 47/70  vessel]
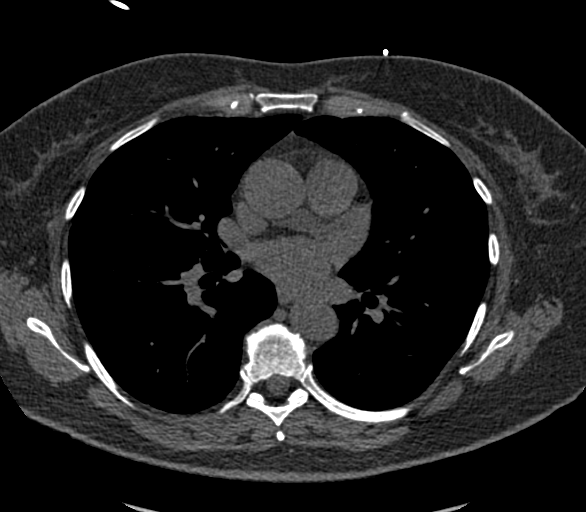
[im 58/70  vessel]
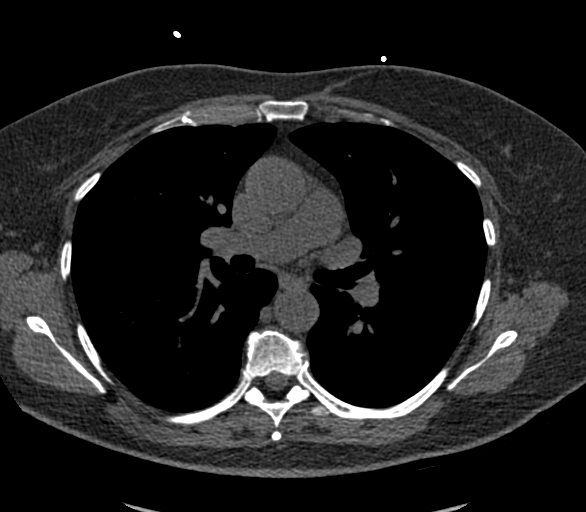
[im 58/70  lung]
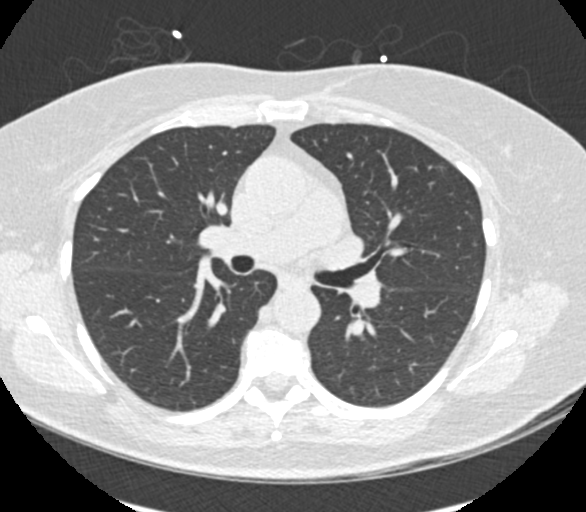

[Series 9: calcium scoring 2.00 br60 bestdiast 70% lungs · axial · 0.57mm/px · z∈[+1673,+1763]mm · 5 of 69 slices shown]
[im 12/69  vessel]
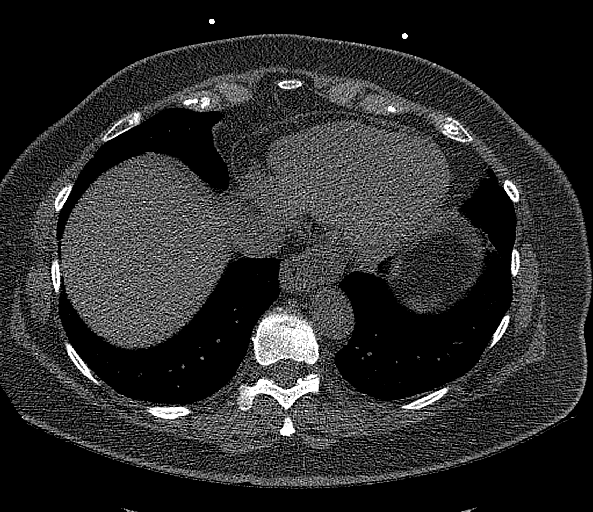
[im 23/69  vessel]
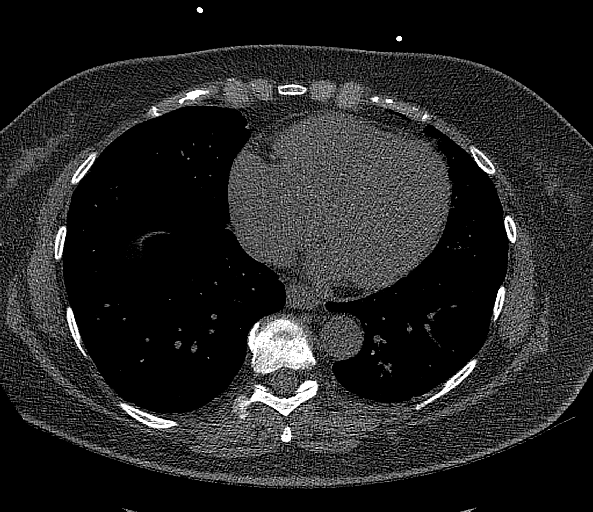
[im 35/69  vessel]
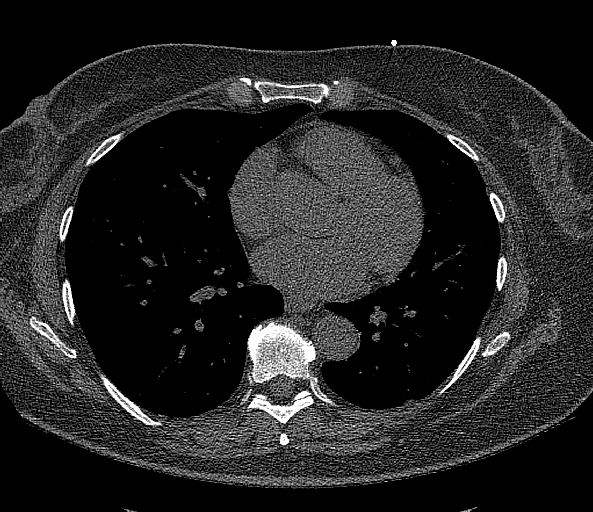
[im 46/69  vessel]
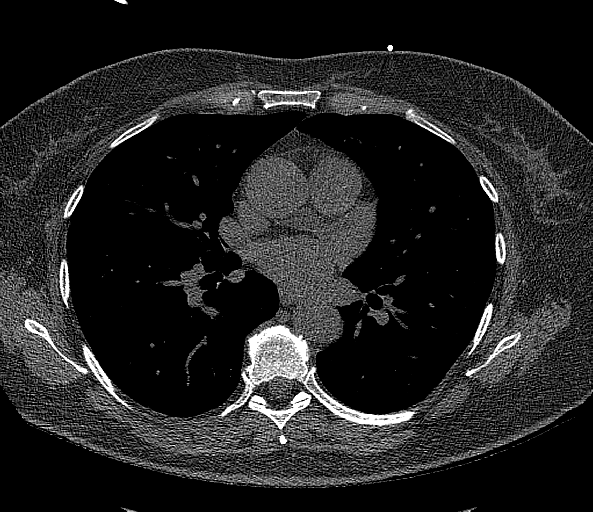
[im 57/69  vessel]
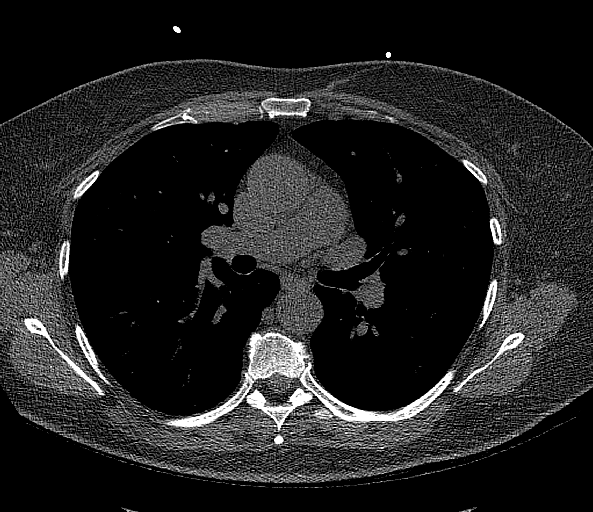

[14 of 20 positions shown; findings below may reference images not displayed]

FINDINGS: CORONARY CALCIUM SCORES:

Total Agatston Score: 0- No coronary calcium identified.

Some

AORTA MEASUREMENTS:

Ascending Aorta: 33 mm

Descending Aorta: 25 mm

OTHER FINDINGS:

Cardiovascular: Normal aortic caliber. Tortuous thoracic aorta.
Normal heart size, without pericardial effusion.

Mediastinum/Nodes: No imaged thoracic adenopathy. Small hiatal
hernia.

Lungs/Pleura: No pleural fluid.  Clear imaged lungs.

Upper Abdomen: Normal imaged portions of the liver, spleen.

Musculoskeletal: No acute osseous abnormality.
IMPRESSION: 1. No coronary calcium identified.
2. Small hiatal hernia.

## 2023-04-11 ENCOUNTER — Telehealth: Payer: Self-pay | Admitting: Cardiology

## 2023-04-11 ENCOUNTER — Ambulatory Visit: Payer: Managed Care, Other (non HMO)

## 2023-04-11 NOTE — Telephone Encounter (Signed)
Pt flight was delayed. Pt states he will call back to reschedule tele appt.

## 2023-04-11 NOTE — Telephone Encounter (Signed)
FYI--7/22 telehealth appointment has been cancelled. Patient's husband states patient's flight was delayed and she will be traveling during appointment time, will call back to reschedule.

## 2023-04-11 NOTE — Progress Notes (Addendum)
APPT CANCELLED 

## 2023-04-11 NOTE — Telephone Encounter (Signed)
I will update the pre op APP as well. Pt will need to reschedule as his flight was delayed. I will also update the requesting office as well.

## 2023-06-06 ENCOUNTER — Encounter: Payer: Self-pay | Admitting: Cardiology

## 2023-06-06 ENCOUNTER — Ambulatory Visit: Payer: Managed Care, Other (non HMO) | Attending: Cardiology | Admitting: Cardiology

## 2023-06-06 VITALS — BP 122/84 | HR 85 | Ht 64.0 in | Wt 181.8 lb

## 2023-06-06 DIAGNOSIS — R079 Chest pain, unspecified: Secondary | ICD-10-CM

## 2023-06-06 DIAGNOSIS — R002 Palpitations: Secondary | ICD-10-CM

## 2023-06-06 NOTE — Progress Notes (Signed)
Cardiology Office Note:  .   Date:  06/12/2023  ID:  Linda Short, DOB 04-05-1956, MRN [id number] PCP: Merri Brunette, MD  [facility] Providers Cardiologist:  Bryan Lemma, MD     Chief Complaint  Patient presents with   Follow-up    Doing well.  No more chest pain   Palpitations    Also well-controlled.    History of Present Illness: .     Linda Short is a previously obese 67 y.o. female  with a PMH notable for HTN, GERD  who presents here for 60-month follow-up to  Reassess Chest Pain and Tachycardia at the request of Merri Brunette, MD.  Linda Short was seen for initial consultation on 11/01/2022 at the request of Dr. Renne Crigler.  Under lots of stress suffer mostly for an the palpitation and chest pain symptoms were also for a while ago.  Their mother unexpectedly passed away and brother was battling cancer- died 2022/04/21(mother 10/22/21)  Very stressful.  Was trying to manage her mother's estate while living in [place] when the family lives in [place].  Try to sell the property but there is also oversees property.  She had had a couple episodes of very fast heart rate and chest discomfort.  She had not had any further episodes since then.  Chest pain was described as right-sided chest pain radiating to the shoulder and back.  Nothing to settle down, symptoms have resolved.  She noted significant weight loss over the last several years.  Previsit evaluation by PCP-Coronary Calcium Score 0 in March 2022.  Zio patch reviewed below.:  Plan: Very reassuring coronary calcium score making chest pain very unlikely to be cardiac in nature, especially given the location and the fact that is no longer occurring with routine activity.  Also, palpitations seem to be reassuring.  There was 1 atrial run that she felt out of the 3D noted on monitor.  Plan was to reassess in 6 to 7 months after all her social issues settle down..    Subjective  INTERVAL HISTORY Linda Mest  Short returns here for 46-month follow-up to reassess her symptoms.  She says that she is actually been fairly well.  She was able to take a pseudo vacation trip to [place] where she was taken care of by her aunt while she was working on her mother's property is there.  She then took a 2-week vacation to just spend time relaxing.  That very much helps her anxiety.  She is also dealing with the fact that her son who is essentially homeless with exception of the house that he was living in with his grandmother before she passed is becoming very difficult to manage as far as any money that was expected to be given by the grandmother.  She is essentially working with a Clinical research associate to basically give him the lump some of what is going left and no longer having pain to do with being the guardian of this money.  She does not think she can handle the stress.  Thankfully though besides all the stressful issues, the chest pain symptoms seem to have resolved but she is not really having that much more as far as any symptoms.  She is otherwise doing fine now with no exertional chest pain or pressure pressure exertion.  No PND, Edema.  No rapid irregular beats palpitations just a few skipped beats here and there when she is anxious but controls that with deep  breathing.  No syncope or near syncope.  No TIA or amaurosis fugax.  No claudication.   ROS:  Review of Systems - Negative except stress and some musculoskeletal pains.     Objective   Studies Reviewed: Marland Kitchen       Coronary Calcium Score March 2022: Agatston score 0 (lipids from January 2024 showed LDL of 150 and TC 234) Zio patch (1/10-24/24) Summary indicates predominantly sinus rhythm with a range of 5340 bpm.  Average 74 bpm.  Rare PACs and PVCs.  3 short AtrialRuns of 5-7 beats with a rate from 115 to 197 bpm  Risk Assessment/Calculations:     Physical Exam:   VS:  BP 122/84   Pulse 85   Ht 5\' 4"  (1.626 m)   Wt 181 lb 12.8 oz (82.5 kg)   SpO2 97%   BMI  31.21 kg/m    Wt Readings from Last 3 Encounters:  06/06/23 181 lb 12.8 oz (82.5 kg)  11/01/22 164 lb (74.4 kg)  06/05/21 158 lb (71.7 kg)    GEN: Well nourished, well developed in no acute distress; just a little anxious NECK: No JVD; No carotid bruits CARDIAC: Normal S1, S2; RRR, no murmurs, rubs, gallops RESPIRATORY:  Clear to auscultation without rales, wheezing or rhonchi ; nonlabored, good air movement. ABDOMEN: Soft, non-tender, non-distended EXTREMITIES:  No edema; No deformity      ASSESSMENT AND PLAN: .    Problem List Items Addressed This Visit     Chest pain of uncertain etiology - Primary    No further episodes of chest pain.  Nothing to suggest anginal symptoms.  Seems to be relatively healthy.  As long as her stress settles down, she should be doing well.    At this point I will believe she needs any additional cardiac evaluation.  With a low Coronary Calcium Score, would defer initiation of statin therapy to PCP as her LDL is 150.  I suspect that she probably does need to get that down to least less than 100, but would defer to Dr. Renne Crigler as far as treatment options.       Palpitations    Symptoms also well-controlled.  She was referred avoid any medications and probably does not need to be on anything out since she had rare PACs and PVCs on monitor.  No significant arrhythmias noted.  Minimal burden of ectopy.  Short atrial runs that were asymptomatic.  Maintain adequate hydration, avoid triggers such as caffeine and sweets etc. Briefly discussed vagal maneuvers for prolonged episodes..               Dispo: Return if symptoms worsen or fail to improve, for Followup when necessary.  Total time spent: 11 min spent with patient + 10 min spent charting = 21 min     Signed, Marykay Lex, MD, MS Bryan Lemma, M.D., M.S. Interventional Cardiologist  [facility]  Pager # [phone] Phone # [phone] [address] [place], [place] [address]

## 2023-06-06 NOTE — Patient Instructions (Addendum)
Medication Instructions:   No changes  *If you need a refill on your cardiac medications before your next appointment, please call your pharmacy*   Lab Work:  Not needed     Testing/Procedures:  Not needed  Follow-Up: At [facility], you and your health needs are our priority.  As part of our continuing mission to provide you with exceptional heart care, we have created designated Provider Care Teams.  These Care Teams include your primary Cardiologist (physician) and Advanced Practice Providers (APPs -  Physician Assistants and Nurse Practitioners) who all work together to provide you with the care you need, when you need it.  We recommend signing up for the patient portal called "MyChart".  Sign up information is provided on this After Visit Summary.  MyChart is used to connect with patients for Virtual Visits (Telemedicine).  Patients are able to view lab/test results, encounter notes, upcoming appointments, etc.  Non-urgent messages can be sent to your provider as well.   To learn more about what you can do with MyChart, go to [email or web address].    Your next appointment:   prn   The format for your next appointment:   In Person  Provider:   Bryan Lemma, MD

## 2023-06-12 ENCOUNTER — Encounter: Payer: Self-pay | Admitting: Cardiology

## 2023-06-12 NOTE — Assessment & Plan Note (Signed)
No further episodes of chest pain.  Nothing to suggest anginal symptoms.  Seems to be relatively healthy.  As long as her stress settles down, she should be doing well.    At this point I will believe she needs any additional cardiac evaluation.  With a low Coronary Calcium Score, would defer initiation of statin therapy to PCP as her LDL is 150.  I suspect that she probably does need to get that down to least less than 100, but would defer to Dr. Renne Short as far as treatment options.

## 2023-06-12 NOTE — Assessment & Plan Note (Signed)
Symptoms also well-controlled.  She was referred avoid any medications and probably does not need to be on anything out since she had rare PACs and PVCs on monitor.  No significant arrhythmias noted.  Minimal burden of ectopy.  Short atrial runs that were asymptomatic.  Maintain adequate hydration, avoid triggers such as caffeine and sweets etc. Briefly discussed vagal maneuvers for prolonged episodes.Linda Short

## 2023-06-27 ENCOUNTER — Ambulatory Visit: Payer: Self-pay | Admitting: Podiatry

## 2023-07-04 ENCOUNTER — Ambulatory Visit: Payer: Managed Care, Other (non HMO) | Admitting: Podiatry

## 2023-07-08 ENCOUNTER — Ambulatory Visit: Payer: Managed Care, Other (non HMO) | Admitting: Podiatry

## 2023-07-08 DIAGNOSIS — M722 Plantar fascial fibromatosis: Secondary | ICD-10-CM

## 2023-07-08 MED ORDER — MELOXICAM 15 MG PO TABS: 15.0000 mg | ORAL_TABLET | Freq: Every day | ORAL | 1 refills | Status: DC

## 2023-07-08 MED ORDER — METHYLPREDNISOLONE 4 MG PO TBPK: ORAL_TABLET | ORAL | 0 refills | Status: DC

## 2023-07-08 MED ORDER — BETAMETHASONE SOD PHOS & ACET 6 (3-3) MG/ML IJ SUSP
3.0000 mg | Freq: Once | INTRAMUSCULAR | Status: AC
  Administered 2023-07-08: 3 mg via INTRA_ARTICULAR

## 2023-07-08 NOTE — Progress Notes (Signed)
   No chief complaint on file.   Subjective: 67 y.o. female presenting today for evaluation of right heel pain ongoing for about 2 months now.  She has not done anything for treatment.  Idiopathic onset.   Past Medical History:  Diagnosis Date   Essential hypertension    GERD (gastroesophageal reflux disease)    History of obesity in adulthood    BMI now less than 30.   Plantar fasciitis      Objective: Physical Exam General: The patient is alert and oriented x3 in no acute distress.  Dermatology: Skin is warm, dry and supple bilateral lower extremities. Negative for open lesions or macerations bilateral.   Vascular: Dorsalis Pedis and Posterior Tibial pulses palpable bilateral.  Capillary fill time is immediate to all digits.  Neurological: Grossly intact via light touch  Musculoskeletal: Tenderness to palpation to the plantar aspect of the right heel along the plantar fascia. All other joints range of motion within normal limits bilateral. Strength 5/5 in all groups bilateral.   Radiographic exam RT foot 07/08/2023: Normal osseous mineralization. Joint spaces preserved. No fracture/dislocation/boney destruction. No other soft tissue abnormalities or radiopaque foreign bodies.  Small posterior and plantar heel spurs noted.  Assessment: 1. Plantar fasciitis right  Plan of Care:  1. Patient evaluated. Xrays reviewed.   2. Injection of 0.5cc Celestone soluspan injected into the right plantar fascia  3. Rx for Medrol Dose Pack placed 4. Rx for Meloxicam ordered for patient. 5.  Recommend good supportive tennis shoes and sneakers 6. Instructed patient regarding therapies and modalities at home to alleviate symptoms.  7. Return to clinic in 4 weeks.     Felecia Shelling, DPM [facility]  Dr. Felecia Shelling, DPM    [address] [place], [place] [address]                Office [phone]  Fax [phone]

## 2023-07-29 ENCOUNTER — Ambulatory Visit: Payer: Managed Care, Other (non HMO) | Admitting: Cardiology

## 2023-08-02 ENCOUNTER — Other Ambulatory Visit: Payer: Self-pay | Admitting: Obstetrics and Gynecology

## 2023-08-02 DIAGNOSIS — N3281 Overactive bladder: Secondary | ICD-10-CM

## 2023-08-09 ENCOUNTER — Ambulatory Visit: Payer: Managed Care, Other (non HMO) | Admitting: Podiatry

## 2023-08-23 ENCOUNTER — Encounter: Payer: Self-pay | Admitting: Obstetrics and Gynecology

## 2023-08-23 ENCOUNTER — Ambulatory Visit: Payer: Managed Care, Other (non HMO) | Admitting: Obstetrics and Gynecology

## 2023-08-23 DIAGNOSIS — N3281 Overactive bladder: Secondary | ICD-10-CM

## 2023-08-23 MED ORDER — TROSPIUM CHLORIDE ER 60 MG PO CP24: 60.0000 mg | ORAL_CAPSULE | Freq: Every day | ORAL | 3 refills | Status: AC

## 2023-08-23 NOTE — Progress Notes (Signed)
[facility] Return Visit  SUBJECTIVE  History of Present Illness: Linda Short is a 66 y.o. female seen in follow-up for OAB. She has been on Trospium 60mg  daily.  Medication is working well. Uses one pad per day, usually precautionary. Otherwise without medication wears 3 pads per day. Has cut back on caffeine. Overall happy with the medication.  Rare leakage with cough or sneeze.    Past Medical History: Patient  has a past medical history of Essential hypertension, History of obesity in adulthood, and Plantar fasciitis.   Past Surgical History: She  has a past surgical history that includes Abdominal hysterectomy; Tonsillectomy; and Appendectomy.   Medications: She has a current medication list which includes the following prescription(s): estradiol, losartan, and trospium chloride.   Allergies: Patient has No Known Allergies.   Social History: Patient  reports that she has never smoked. She has never used smokeless tobacco. She reports that she does not currently use alcohol after a past usage of about 1.0 standard drink of alcohol per week. She reports that she does not use drugs.      OBJECTIVE     Physical Exam: Vitals:   08/23/23 0845  BP: 120/78  Pulse: 69    Gen: No apparent distress, A&O x 3.  Detailed Urogynecologic Evaluation:  Deferred.    ASSESSMENT AND PLAN    Linda Short is a 67 y.o. with:  1. Overactive bladder      - Continue Trospium 60mg  ER daily. Refills sent today  Follow up 1 year or sooner if needed  Marguerita Beards, MD  Time spent: I spent 20 minutes dedicated to the care of this patient on the date of this encounter to include pre-visit review of records, face-to-face time with the patient and post visit documentation and ordering medication/ testing.

## 2024-07-19 ENCOUNTER — Ambulatory Visit
Admission: EM | Admit: 2024-07-19 | Discharge: 2024-07-19 | Disposition: A | Discharge status: Home / Self Care | Attending: Emergency Medicine | Admitting: Emergency Medicine

## 2024-07-19 DIAGNOSIS — B349 Viral infection, unspecified: Secondary | ICD-10-CM | POA: Insufficient documentation

## 2024-07-19 DIAGNOSIS — J029 Acute pharyngitis, unspecified: Secondary | ICD-10-CM | POA: Insufficient documentation

## 2024-07-19 LAB — RESP PANEL BY RT-PCR (FLU A&B, COVID) ARPGX2
Influenza A by PCR: NEGATIVE
Influenza B by PCR: NEGATIVE
SARS Coronavirus 2 by RT PCR: NEGATIVE

## 2024-07-19 LAB — GROUP A STREP BY PCR: Group A Strep by PCR: NOT DETECTED

## 2024-07-19 NOTE — ED Provider Notes (Signed)
 [facility]    CSN: [id number] Arrival date & time: 07/19/24  1916      History   Chief Complaint No chief complaint on file.   HPI Linda Short is a 68 y.o. female.   68 year old female, Linda Short, presents to urgetn care for evaluation of flu like symptoms(fatigue and body aches, sore throat) x 2 days. Pt recently traveled to event in [place] , airplane travel. No meds taken today.  The history is provided by the patient. No language interpreter was used.    Past Medical History:  Diagnosis Date   Essential hypertension    History of obesity in adulthood    BMI now less than 30.   Plantar fasciitis     Patient Active Problem List   Diagnosis Date Noted   Nonspecific syndrome suggestive of viral illness 07/19/2024   Sore throat 07/19/2024   Palpitations 11/04/2022   Chest pain of uncertain etiology 11/04/2022    Past Surgical History:  Procedure Laterality Date   ABDOMINAL HYSTERECTOMY     Still has ovaries   APPENDECTOMY     TONSILLECTOMY      OB History     Gravida  1   Para      Term      Preterm      AB      Living  1      SAB      IAB      Ectopic      Multiple      Live Births  1            Home Medications    Prior to Admission medications   Medication Sig Start Date End Date Taking? Authorizing Provider  estradiol (VIVELLE-DOT) 0.0375 MG/24HR Please specify directions, refills and quantity   Yes [provider]  losartan (COZAAR) 50 MG tablet Take 50 mg by mouth daily. 12/14/22  Yes [provider]  Trospium  Chloride 60 MG CP24 Take 1 capsule (60 mg total) by mouth daily. 08/23/23  Yes Marilynne Rosaline SAILOR, MD    Family History Family History  Problem Relation Age of Onset   Hypertension Mother    Hyperlipidemia Father    Kidney failure Father    Cancer - Colon Brother        Appendix    Social History Social History   Tobacco Use   Smoking status: Never   Smokeless  tobacco: Never  Vaping Use   Vaping status: Never Used  Substance Use Topics   Alcohol use: Not Currently    Alcohol/week: 1.0 standard drink of alcohol    Types: 1 Standard drinks or equivalent per week    Comment: Occasionally once or twice a week   Drug use: Never     Allergies   Patient has no known allergies.   Review of Systems Review of Systems  Constitutional:  Positive for fatigue and fever.  HENT:  Positive for congestion.   Musculoskeletal:  Positive for myalgias.  Neurological:  Positive for headaches.  All other systems reviewed and are negative.    Physical Exam Triage Vital Signs ED Triage Vitals  Encounter Vitals Group     BP 07/19/24 1946 135/85     Girls Systolic BP Percentile --      Girls Diastolic BP Percentile --      Boys Systolic BP Percentile --      Boys Diastolic BP Percentile --      Pulse  Rate 07/19/24 1946 83     Resp 07/19/24 1946 12     Temp 07/19/24 1946 99.7 F (37.6 C)     Temp Source 07/19/24 1946 Oral     SpO2 07/19/24 1946 96 %     Weight 07/19/24 1942 197 lb 8 oz (89.6 kg)     Height --      Head Circumference --      Peak Flow --      Pain Score 07/19/24 1945 8     Pain Loc --      Pain Education --      Exclude from Growth Chart --    No data found.  Updated Vital Signs BP 135/85 (BP Location: Left Arm)   Pulse 83   Temp 99.7 F (37.6 C) (Oral)   Resp 12   Wt 197 lb 8 oz (89.6 kg)   SpO2 96%   BMI 33.90 kg/m   Visual Acuity Right Eye Distance:   Left Eye Distance:   Bilateral Distance:    Right Eye Near:   Left Eye Near:    Bilateral Near:     Physical Exam Vitals and nursing note reviewed.  Constitutional:      General: She is not in acute distress.    Appearance: She is well-developed.  HENT:     Head: Normocephalic.     Right Ear: Tympanic membrane is retracted.     Left Ear: Tympanic membrane is retracted.     Nose: Congestion present.     Mouth/Throat:     Lips: Pink.     Mouth: Mucous  membranes are moist.     Pharynx: Oropharynx is clear.  Eyes:     General: Lids are normal.     Conjunctiva/sclera: Conjunctivae normal.     Pupils: Pupils are equal, round, and reactive to light.  Neck:     Trachea: No tracheal deviation.  Cardiovascular:     Rate and Rhythm: Normal rate and regular rhythm.     Heart sounds: Normal heart sounds. No murmur heard. Pulmonary:     Effort: Pulmonary effort is normal.     Breath sounds: Normal breath sounds and air entry.  Abdominal:     General: Bowel sounds are normal.     Palpations: Abdomen is soft.     Tenderness: There is no abdominal tenderness.  Musculoskeletal:        General: Normal range of motion.     Cervical back: Normal range of motion.  Lymphadenopathy:     Cervical: No cervical adenopathy.  Skin:    General: Skin is warm and dry.     Findings: No rash.  Neurological:     General: No focal deficit present.     Mental Status: She is alert and oriented to person, place, and time.     GCS: GCS eye subscore is 4. GCS verbal subscore is 5. GCS motor subscore is 6.  Psychiatric:        Attention and Perception: Attention normal.        Mood and Affect: Mood normal.        Speech: Speech normal.        Behavior: Behavior normal. Behavior is cooperative.      UC Treatments / Results  Labs (all labs ordered are listed, but only abnormal results are displayed) Labs Reviewed  RESP PANEL BY RT-PCR (FLU A&B, COVID) ARPGX2  GROUP A STREP BY PCR    EKG   Radiology  No results found.  Procedures Procedures (including critical care time)  Medications Ordered in UC Medications - No data to display  Initial Impression / Assessment and Plan / UC Course  I have reviewed the triage vital signs and the nursing notes.  Pertinent labs & imaging results that were available during my care of the patient were reviewed by me and considered in my medical decision making (see chart for details).    Strep,covid,flu are all  negative. Discussed exam findings and plan of care with patient, strict go to ER precautions given.   Patient verbalized understanding to this provider.  Ddx: Viral illness,strep, allergies Final Clinical Impressions(s) / UC Diagnoses   Final diagnoses:  Nonspecific syndrome suggestive of viral illness  Sore throat     Discharge Instructions      Your covid,flu and strep are negative. Most likely you have a viral illness: no antibiotic is indicated at this time, May treat with OTC meds of choice(tylenol, chloraseptic throat lozenges, warm salt water gargles, Coricidin HBP as label directed). Make sure to drink plenty of fluids to stay hydrated(gatorade, water, popsicles,jello,etc), avoid caffeine products. Follow up with PCP.     ED Prescriptions   None    PDMP not reviewed this encounter.   Aminta Loose, NP 07/19/24 2034

## 2024-07-19 NOTE — Discharge Instructions (Addendum)
 Your covid,flu and strep are negative. Most likely you have a viral illness: no antibiotic is indicated at this time, May treat with OTC meds of choice(tylenol, chloraseptic throat lozenges, warm salt water gargles, Coricidin HBP as label directed). Make sure to drink plenty of fluids to stay hydrated(gatorade, water, popsicles,jello,etc), avoid caffeine products. Follow up with PCP.

## 2024-07-19 NOTE — ED Triage Notes (Signed)
 Pt c/o fatigue and body aches x3days  Pt states that she was around a friend who had a sinus infection  Pt states that her throat feels like she swallowed razor blades
